# Patient Record
Sex: Female | Born: 2000
Health system: Southern US, Academic
[De-identification: ages and names within clinical notes are randomized; demographics above are authoritative.]

## PROBLEM LIST (undated history)

## (undated) ENCOUNTER — Ambulatory Visit

## (undated) ENCOUNTER — Ambulatory Visit: Attending: Pharmacist | Primary: Pharmacist

## (undated) ENCOUNTER — Encounter: Attending: Physician Assistant | Primary: Physician Assistant

## (undated) ENCOUNTER — Encounter
Payer: PRIVATE HEALTH INSURANCE | Attending: Rehabilitative and Restorative Service Providers" | Primary: Rehabilitative and Restorative Service Providers"

## (undated) ENCOUNTER — Encounter: Attending: Neurology | Primary: Neurology

## (undated) ENCOUNTER — Telehealth

## (undated) ENCOUNTER — Telehealth: Attending: Physician Assistant | Primary: Physician Assistant

## (undated) ENCOUNTER — Encounter

## (undated) ENCOUNTER — Encounter
Attending: Pharmacist Clinician (PhC)/ Clinical Pharmacy Specialist | Primary: Pharmacist Clinician (PhC)/ Clinical Pharmacy Specialist

## (undated) ENCOUNTER — Telehealth: Attending: Neurology | Primary: Neurology

## (undated) ENCOUNTER — Ambulatory Visit: Payer: PRIVATE HEALTH INSURANCE

## (undated) ENCOUNTER — Non-Acute Institutional Stay: Payer: PRIVATE HEALTH INSURANCE

## (undated) ENCOUNTER — Ambulatory Visit
Attending: Pharmacist Clinician (PhC)/ Clinical Pharmacy Specialist | Primary: Pharmacist Clinician (PhC)/ Clinical Pharmacy Specialist

## (undated) ENCOUNTER — Encounter: Payer: PRIVATE HEALTH INSURANCE | Attending: Obesity Medicine | Primary: Obesity Medicine

## (undated) ENCOUNTER — Encounter: Payer: PRIVATE HEALTH INSURANCE | Attending: Physician Assistant | Primary: Physician Assistant

## (undated) ENCOUNTER — Encounter: Attending: Registered" | Primary: Registered"

## (undated) ENCOUNTER — Ambulatory Visit: Payer: PRIVATE HEALTH INSURANCE | Attending: Physician Assistant | Primary: Physician Assistant

## (undated) ENCOUNTER — Telehealth: Attending: Nephrology | Primary: Nephrology

## (undated) ENCOUNTER — Ambulatory Visit: Payer: MEDICAID

## (undated) ENCOUNTER — Encounter: Attending: Pharmacist | Primary: Pharmacist

## (undated) ENCOUNTER — Non-Acute Institutional Stay
Payer: PRIVATE HEALTH INSURANCE | Attending: Rehabilitative and Restorative Service Providers" | Primary: Rehabilitative and Restorative Service Providers"

## (undated) ENCOUNTER — Encounter: Payer: PRIVATE HEALTH INSURANCE | Attending: Registered" | Primary: Registered"

## (undated) ENCOUNTER — Encounter
Attending: Rehabilitative and Restorative Service Providers" | Primary: Rehabilitative and Restorative Service Providers"

## (undated) ENCOUNTER — Encounter: Attending: Physical Medicine & Rehabilitation | Primary: Physical Medicine & Rehabilitation

## (undated) ENCOUNTER — Ambulatory Visit
Payer: PRIVATE HEALTH INSURANCE | Attending: Rehabilitative and Restorative Service Providers" | Primary: Rehabilitative and Restorative Service Providers"

## (undated) ENCOUNTER — Encounter: Attending: Mental Health | Primary: Mental Health

## (undated) ENCOUNTER — Encounter
Payer: PRIVATE HEALTH INSURANCE | Attending: Physical Medicine & Rehabilitation | Primary: Physical Medicine & Rehabilitation

## (undated) DIAGNOSIS — G35 Multiple sclerosis: Secondary | ICD-10-CM

## (undated) HISTORY — DX: Multiple sclerosis: G35

## (undated) MED ORDER — CHOLECALCIFEROL (VITAMIN D3) 100 MCG (4,000 UNIT) CAPSULE: ORAL | 0.00000 days

---
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
## 2020-01-04 NOTE — Unmapped (Signed)
[facility]  [address] [place], [place] [address]    [phone]    Donna Mata did not show for her  scheduled Speech Therapy follow-up session. SLP called preferred number listed to follow up but did not get a response [phone]). SLP left VM to call clinic to confirm future appointments. Please contact me if you have any questions or concerns.     Thank you for this referral,     Signed: Ilda Mori, SLP  01/04/2020 10:42 AM

## 2020-01-04 NOTE — Unmapped (Signed)
[facility]  [address] [place], [place] [address]    [phone]    Donnamarie Poag Tavella did not show for her  scheduled Occupational Therapy follow-up session.  Please contact me if you have any questions or concerns.     Thank you for this referral,     Signed: Dierdre Harness, OT  01/04/2020 10:24 AM

## 2020-01-04 NOTE — Unmapped (Signed)
[facility]  [address]                                    [place], [place] [address]    Chemika did not show for their scheduled Physical Therapy follow-up session.  Le has been contacted via telephone call.  I was unable to leave a voicemail due to the number on file is not her personal number   This is the patient's first offense.      Signed: Maudry Diego, PT  01/04/2020, 8:34 AM

## 2020-01-08 ENCOUNTER — Other Ambulatory Visit (HOSPITAL_COMMUNITY): Payer: Self-pay | Admitting: Psychiatry

## 2020-01-08 MED ORDER — PROMETHAZINE 12.5 MG TABLET
ORAL_TABLET | Freq: Four times a day (QID) | ORAL | 0 refills | 0 days | PRN
Start: 2020-01-08 — End: ?

## 2020-01-10 MED ORDER — PROMETHAZINE 12.5 MG TABLET
ORAL_TABLET | Freq: Four times a day (QID) | ORAL | 2 refills | 8.00000 days | Status: CP | PRN
Start: 2020-01-10 — End: ?

## 2020-01-10 NOTE — Unmapped (Signed)
Last Visit Date: 11/04/2019  Next Visit Date: No future appointment scheduled  RECALLS set 01/04/2020    REFILL REQUEST  Promethazine(Phenergan)12.5 m  Last written 11/16/2019 QTY 30 tablet with no refill        [name]  Lab Results   Component Value Date    JC Virus AB POSITIVE 08/13/2016    Hep B Surface Ag Nonreactive 03/19/2019    Hep B S Ab Nonreactive 03/12/2018    Hep B Surf Ab Quant <8.00 03/12/2018    Hep B Core Total Ab Nonreactive 03/19/2019    Hepatitis C Ab Reactive (A) 07/06/2018    HIV Antigen/Antibody Combo Nonreactive 03/12/2018    HIV Antigen/Antibody Combo Nonreactive 07/21/2014        No results found for this or any previous visit.      No results found for this or any previous visit.      No results found for this or any previous visit.

## 2020-01-17 NOTE — Unmapped (Signed)
[facility]  [address] [place], [place] [address]    [phone]    Gyselle did not show for their scheduled Physical Therapy follow-up session.  Anniya has been contacted via telephone call.  I was unable to leave a voicemail due to the number on file is not her personal number   This is the patient's second offense. If she does not show for her next appointment she will be discharged from skilled PT due to non-compliance with our attendance policy.     Thank you for this referral,     Signed: Maudry Diego, PT  01/17/2020 4:51 PM

## 2020-01-17 NOTE — Unmapped (Signed)
[facility]  [address]                                    [place], [place] [address]    Donna Mata did not show for scheduled ST Therapy follow-up session.  Donna Mata has been contacted via telephone call. to starred phone number in Epic (her mother's mobile phone number) without response/answer.  This is the patient's second offense. Please contact me with any questions.     Signed: Sadie Haber, SLP  01/17/2020, 2:38 PM

## 2020-01-20 DIAGNOSIS — G35 Multiple sclerosis: Principal | ICD-10-CM

## 2020-01-20 NOTE — Unmapped (Addendum)
[facility]  Multiple Sclerosis / Neuroimmunology Division  Carola Viramontes [name] [facility]  Physician Assistant    Phone: [phone]  Fax: [phone]      Patient Name: Donna Mata [facility]   Date of Birth: 31-Jul-2000  Medical Record Number: [id number]  [address]  [place] [address]     Direct entry by:  Cy Blamer, PA-C.  Supervising Physician: Dr. [name] Dujmovic/ Dr. Quillian Quince.    DATE OF VISIT: January 20, 2020    REASON FOR VISIT: Followup in the Neuroimmunology Clinic for evaluation of Multiple Sclerosis / Demyelinating Disease / or other Neurological problem.    **Telophone call with [employer]. Total time < 5 minutes. No charge for this visit.  **Seen by Dr. Johnnye Lana 04/24/2016.    ASSESSMENT AND PLAN:  ** Relapsing Remitting Multiple Sclerosis.  **Interim visit to get an update of symptoms.  -Continuation of pain, numbness and tingling of lower extremities, slightly worse. Continue with Amitriptyline.  -Neurogenic bladder, (Leakage and urgency): Continue to monitor.    -Recommend thoracic MRI.  Brain and cervical spine from 11/23/2019 show no changes.  -Started Kesimpta 04/12/2019.    -Follow up as scheduled.    INTERVAL HISTORY / CHIEF COMPLAINT :  Interim visit to get an update of symptoms. Patient states that her lower extremity pain, numbness and tingling has gotten slightly worse since our last visit in July 2021. It has slowly been progressing. Reports no weakness or problems with walking. Denies Fever, chills, infections or UTI's. Currently taking Amitriptyline at bedtime.     PRIOR HISTORY: A 19 y.o.caucasian female.  Patient presented to [facility] for parasthesias and weakness with notable multiple enhancing and non enhancing lesions throughout brain and spinal cord consistent with MS. Lesions not typical for ADEM and she had no prior viral illness or immunization before episode. Her lumbar puncture was also consistent with demyelinating disease with no evidence of infection and elevated IGG index with 4+ oligoclonal bands. Diagnosed with RRMS by Dr. Alberteen Spindle 09/2014 and started on Gilenya.    Hep C AB positive, Hep C RNA, quantitative, ??PCR undetected. LFT's wnl. ??   Phone consult with Hepatology Dr Woodfin Ganja. repeated Hep C AB positve. Most likely false positive.  There is a 5% chance of vertical transmission.  If this were to be the case most children clear the virus spontaneously by age three. Recommendation would still be the same as above.    LUMBAR PUNCTURE:  07/21/2014 Four or more Oligoclonal bands and IgG index = 0.8.    SCREENING LABS:   07/21/2014 : ACE 20, ANA neg, RF <6.3, , B12 420 , NMO APQ4 IgG neg and Vit D 25-OH 20.  04/17/2017  RPR,  Lyme serology and   MOG IgG1 are all negative.  03/12/2018 JCV positive, index = 3.51.    07/17/2017 Baseline:  T25FW= 4.98. seconds.  MMSE = 26.  PHQ9 = 8.    MS FLARE-UP HISTORY:  Received IVMP while in hospital at time of diagnoses.  04/24/2016 Left arm and left leg weakness, left-sided facial and body sensory disturbance with perception of pain, sleep disturbances in the setting of URI. Received 5 days of IVMP with 100% resolution.  04/26/2018  Left side weakness and  numbness and tingling from head to toe Seen at [facility] ED and received IV Steroids x one. Sent home with oral Prednisone for 4 days. !00% resolved.  Clinical flare-up from  05/01/2018 100% resolved. Symptoms were Numbness or tingling. Received one day of IV steroids followed by four days of oral.  11/16/2018 Received 3 days of oral steroids 1250mg  for eye symptoms. Eye symptoms were unchanged with the steroids.    MS MEDICATION:  Gilenya started 12/13/2014 -08/2016.  Flare up 03/2016 that required 5 days of IVMP with 100% resolution. New ring enahnacing lesion seen on brain MRI and new cervical spine non enhancing lesion.  JCV = positive with index= 3.51.  Tecfidera 08/2016 - 03/2018.  induction of Ocrevus 300mg  on  04/23/2018 (partial) and 300mg  on  05/07/2018 (full) and  600mg  11/09/2018 ( partial). Thickness/knot/ irratation in throat, stomach burning. Discontinued 11/09/2018.  Oral prednisone monthly starting 11/2018.    GYN HISTORY HISTORY:  Menses started at age 19.    FAMILY HISTORY:  No MS. Mother under going work up for Lupus.  Great grandfather and grandmother with IDDM.    REVIEW OF SYSTEMS:  A 10-systems review was performed and, unless otherwise noted, declared negative by patient.    No visits with results within 1 Month(s) from this visit.   Latest known visit with results is:   Appointment on 03/19/2019   Component Date Value Ref Range Status   ??? Total IgG 03/19/2019 762  600-1,700 mg/dL Final   ??? Rituxin ZO10% 03/19/2019 100   Final   ??? CD3% (T Cells) 03/19/2019 83  61 - 86 % Final   ??? Absolute CD3 Count 03/19/2019 1,651  915-3,400 /uL Final   ??? CD19% (B Cells) 03/19/2019 1* 7 - 23 % Final   ??? Absolute CD19 Count 03/19/2019 20* 105 - 920 /uL Final   ??? CD16/56% NK Cell 03/19/2019 15  1 - 27 % Final   ??? Absolute CD16/56 Count 03/19/2019 298  15-1,080 /uL Final   ??? IgM 03/19/2019 92  35 - 290 mg/dL Final       No visits with results within 1 Month(s) from this visit.   Latest known visit with results is:   Appointment on 03/19/2019   Component Date Value Ref Range Status   ??? Total IgG 03/19/2019 762  600-1,700 mg/dL Final   ??? Rituxin RU04% 03/19/2019 100   Final   ??? CD3% (T Cells) 03/19/2019 83  61 - 86 % Final   ??? Absolute CD3 Count 03/19/2019 1,651  915-3,400 /uL Final   ??? CD19% (B Cells) 03/19/2019 1* 7 - 23 % Final   ??? Absolute CD19 Count 03/19/2019 20* 105 - 920 /uL Final   ??? CD16/56% NK Cell 03/19/2019 15  1 - 27 % Final   ??? Absolute CD16/56 Count 03/19/2019 298  15-1,080 /uL Final   ??? IgM 03/19/2019 92  35 - 290 mg/dL Final       PROBLEM LIST:    Patient Active Problem List   Diagnosis   ??? Fatigue   ??? Rash   ??? Obesity   ??? Multiple sclerosis (CMS-HCC)   ??? Multiple sclerosis exacerbation (CMS-HCC)   ??? Neuropathic pain   ??? Chronic bilateral low back pain without sciatica   ??? Eye pain, bilateral   ??? Type 2 diabetes mellitus without complication (CMS-HCC)   ??? Immunosuppression due to drug therapy   ??? BMI 50.0-59.9, adult (CMS-HCC)     Current Outpatient Medications   Medication Sig Dispense Refill   ??? acetaminophen (TYLENOL ORAL) Take by mouth.     ??? amantadine HCL (SYMMETREL) 100 mg capsule ONE TWO CAPSULS FIRST THING IN THE  MORNING. IF NEEDED MAY TAKE A ONE TO TWO CAPSULES BETWEEN 12-2PM. 60 capsule 5   ??? amitriptyline (ELAVIL) 25 MG tablet Three tablets at bedtime. 90 tablet 5   ??? buPROPion (WELLBUTRIN XL) 300 MG 24 hr tablet Take 1 tablet (300 mg total) by mouth every morning. 30 tablet 2   ??? cholecalciferol, vitamin D3-1,250 mcg, 50,000 unit,, 1,250 mcg (50,000 unit) capsule Take 1 capsule (1,250 mcg total) by mouth once a week. 24 capsule 0   ??? cyclobenzaprine (FLEXERIL) 5 MG tablet Take by mouth.     ??? diphenhydramine HCl (BENADRYL ORAL) Take by mouth.     ??? naproxen (NAPROSYN) 500 MG tablet TAKE 1 TABLET BY MOUTH TWICE A DAY WITH MEALS     ??? ofatumumab (KESIMPTA PEN) 20 mg/0.4 mL PnIj Inject contents of 1 syringe (20 mg) under the skin every 28 days. 1.6 mL 5   ??? pantoprazole (PROTONIX) 20 MG tablet Take 1 tablet (20 mg total) by mouth two (2) times a day. 180 tablet 1   ??? promethazine (PHENERGAN) 12.5 MG tablet TAKE 1 TABLET (12.5 MG TOTAL) BY MOUTH EVERY SIX (6) HOURS AS NEEDED FOR NAUSEA. 30 tablet 2   ??? traZODone (DESYREL) 100 MG tablet Take 100 mg by mouth.       No current facility-administered medications for this visit.         Past Surgical Hx:    Past Surgical History:   Procedure Laterality Date   ??? NO PAST SURGERIES         Social Hx:    Social History     Socioeconomic History   ??? Marital status: Single     Spouse name: Not on file   ??? Number of children: Not on file   ??? Years of education: Not on file   ??? Highest education level: Not on file   Occupational History   ??? Not on file Tobacco Use   ??? Smoking status: Never Smoker   ??? Smokeless tobacco: Never Used   Substance and Sexual Activity   ??? Alcohol use: No     Alcohol/week: 0.0 standard drinks   ??? Drug use: No   ??? Sexual activity: Never   Other Topics Concern   ??? Not on file   Social History Narrative    Patient lives at home with Mom, stepfather, and 46 yo brother. She is in 8th grade and is currently not doing well in school. She denies bullying or other psychosocial stressors. She fells safe at home. She denies tobacco, alcohol, and drug use. She has never been sexually active. She is on OCPs to regular her periods. LMP was 2/28.      Social Determinants of Health     Financial Resource Strain:    ??? Difficulty of Paying Living Expenses:    Food Insecurity:    ??? Worried About Programme researcher, broadcasting/film/video in the Last Year:    ??? Barista in the Last Year:    Transportation Needs:    ??? Freight forwarder (Medical):    ??? Lack of Transportation (Non-Medical):    Physical Activity:    ??? Days of Exercise per Week:    ??? Minutes of Exercise per Session:    Stress:    ??? Feeling of Stress :    Social Connections:    ??? Frequency of Communication with Friends and Family:    ??? Frequency of Social Gatherings with Friends and Family:    ???  Attends Religious Services:    ??? Active Member of Clubs or Organizations:    ??? Attends Banker Meetings:    ??? Marital Status:        Family Hx:    Family History   Problem Relation Age of Onset   ??? Diabetes type II Mother    ??? Asthma Mother    ??? Diabetes Mother    ??? Hypertension Mother    ??? No Known Problems Father    ??? ADD / ADHD Brother    ??? Cancer Maternal Grandmother    ??? Glaucoma Neg Hx    ??? Macular degeneration Neg Hx    ??? Retinal detachment Neg Hx    ??? Strabismus Neg Hx      ALLERGIES:    Allergies   Allergen Reactions   ??? Ocrelizumab Anaphylaxis

## 2020-01-20 NOTE — Unmapped (Signed)
erro  neous encounter

## 2020-01-21 NOTE — Unmapped (Signed)
Donna Mata/Donna Mata  I just received a fax from the insurance company that I will load to patient's chart.    It's an approval for the MRI thoracic spine.    I sent a My Chart message to the patient/mom to advise and provided them with the phone # to Radiology Scheduling to call and make the appointment.     Thanks, [employer]

## 2020-02-01 ENCOUNTER — Encounter: Admit: 2020-02-01 | Discharge: 2020-02-01 | Payer: PRIVATE HEALTH INSURANCE

## 2020-02-01 ENCOUNTER — Ambulatory Visit
Admit: 2020-01-17 | Discharge: 2020-02-01 | Payer: PRIVATE HEALTH INSURANCE | Attending: Rehabilitative and Restorative Service Providers" | Primary: Rehabilitative and Restorative Service Providers"

## 2020-02-01 ENCOUNTER — Ambulatory Visit: Admit: 2020-01-17 | Discharge: 2020-02-01 | Payer: PRIVATE HEALTH INSURANCE

## 2020-02-01 ENCOUNTER — Ambulatory Visit
Admit: 2020-01-04 | Discharge: 2020-02-01 | Payer: PRIVATE HEALTH INSURANCE | Attending: Rehabilitative and Restorative Service Providers" | Primary: Rehabilitative and Restorative Service Providers"

## 2020-02-01 ENCOUNTER — Encounter
Admit: 2020-02-01 | Discharge: 2020-02-01 | Payer: PRIVATE HEALTH INSURANCE | Attending: Rehabilitative and Restorative Service Providers" | Primary: Rehabilitative and Restorative Service Providers"

## 2020-02-01 NOTE — Unmapped (Signed)
OUTPATIENT OCCUPATIONAL THERAPY   Note Type: Treatment Note         Patient Name: Donna Mata  Date of Birth:2001-03-23  Date: 02/01/2020  Visit #:  [id number]  Referring Physician: Champ Mata, Ire*  Date of Onset of Injury: 2016   Date of Evaluation: 11/04/19  Plan of Care: medicaid 10 visits remaining   Encounter Diagnoses   Name Primary?   ??? Multiple sclerosis (CMS-HCC) Yes   ??? Type 2 diabetes mellitus without complication, without long-term current use of insulin (CMS-HCC)        ASSESSMENT  Patient is a 19 y.o. year old female with history of Multiple Sclerosis, Type 2 Diabetes (diagnosis in the chart but patient reports it is pre-diabetes), generalized fatigue, weakness and apathy at times who presents with difficulty to complete basic ADLs as well as IADLs.  Patient has begun her schooling for 3 out of 4 of her classes.  Will start the 4th class in about 1 week.  She reports no concerns to complete school work but benefited from compensatory training for cooking, cleaning and school work.  Began bilateral hand strength coordination and education.  Patient requires skilled outpatient Occupational Therapy services to address the above deficits and maximize independence with ADLs and IADLs in home environment.    After review of pt's occupational profile and history, assessment of occupational performance, clinical decision making, and development of POC, pt presents as a moderate complexity case.     Patient wore a mask for the entire therapy session., Patient's companion wore a mask for the entire therapy session., Therapist wore a mask for the entire session.  and Therapist wore eye goggles/frames during the entire session.        Goals  1. In 4 visits, patient will perform home exercise program with need for cuing to max IND with ADLs and IADLs.  2. In 4 visits, patient will report at least 1 compensatory strategy to improve school work participation and endurance.  3. In 4 visits, patient will report at least 1 energy conservation strategy to improve cooking and cleaning participation.   4. In 4 visits, patient will improve her fine motor coordination as assessed by 9 hole peg test to Right 32 secs and Left 31secs or better.  5. In 4 visits, patient will participation in further visual assessment.       Patient in agreement with plan of care?: yes    Prognosis for goal achievement: Good due to good motivation and supportive family.    PLAN  Pt. will participate in:  Therapeutic Exercise  Therapeutic Acitivity  Neuromuscular Re-education  Self-care home training    Planned frequency and duration of treatment:  1x every other week for 4 weeks.  Plan will be adjusted as needed.     Past Medical History:   Past Medical History:   Diagnosis Date   ??? Hematuria 2006    resolved   ??? Migraine 10/12/2018   ??? MS (multiple sclerosis) (CMS-HCC) 07/17/2014   ??? Obesity    ??? Perinatal subependymal hemorrhage 2002    confirmed by [place]   ??? Type 2 diabetes mellitus without complication (CMS-HCC) 06/28/2019   ??? Vitamin D deficiency 2016       Past Surgical History:   Past Surgical History:   Procedure Laterality Date   ??? NO PAST SURGERIES         SUBJECTIVE  Online classes: history, office application, advanced office application, english  4 classes this semester  3 hours a day of school work   Taking breaks about every hour   Sit on couch to watch tv or stretch back   Taking dog out every 35 hours   6 week old dog     History of Present Condition:   Per PA Note:  Relapsing Remitting Multiple Sclerosis.  -Started Kesimpta 04/12/2019.  -Reviewed labs from OSH from 09/03/2019 which are located in Care Everywhere.  -Keep MRI appointment on 10/11/2019.  ??  **Cognitive decline: Refer to [facility] speech for testing and treatment plan.  **Fatigue: Start Amantadine 100mg , May take one two capsuls first thing in the morning. If needed repeat between 12-2pm.  **Polypharmacy: Followed by pain management and PCP who started patient on Tramodol 50mg  prn ,Trazodone 100mg  at bedtime and Flexeril 5mg  prn along with Amitriptyline 25mg  at bedtime and Phenergan 12.5mg  prn  that I prescribe. Discussed this with patient and mother .   Advised that if she feels like Amitriptyline is not working and wishes to discontinue it that she will need to decrease by 25% of the daily dose every 2 weeks. My chart message from patient and she confirms that she is taking Amitriptyline 75mg  at bedtime and wishes to continue on this dose.  ??  **Improved right ear symptoms. Continue to monitor.  ??  -Follow up in 3 months. Discuss the timing of the COVID-19 vaccine.    Precautions: fall risk, memory concerns     Prior Therapies: OT in hospital and post 5 years prior     Patient???s Goals: to finish school and obtain job    Pain  Pain present?: yes;  Location: bilateral knees shooting pain    Frequency/Duration: continuously  Intensity:  5 /10 today.   Aggravating Factors: walking for longer   Relieving Factors:meds helps to reduce     Social History:   Lives with mom Donna Mata   1 level home 3 steps to enter      Tub shower 1 installed and 2 suction cups   has a shower chair in storage   Regular toilet no concerns         2 adult cats and kitten     OBJECTIVE    Self Care Training 25 mins:    Occupational Therapy on 02/01/20:  Text to speech to listen to books or other notes for school work   Cooking Helpful Tips:  ??? Prepare - get items you need before you start a task  o Getting all the items out at one time for cooking to reduce steps and time in standing vs as you are going  ??? Cleaning while you cook- throwing items away as you go and rinse and dawn spray before sitting down to eat     Work Simplification  ??? Slide objects instead of lifting or carrying (like a pot full of water slide on counter top)  ??? Keep most used things on counter   ??? Work on things at waist height or sitting at table (sitting on step ladder to chop; prechopping as able)  ??? Avoid stooping, bending, or reaching over head (using step stool)    Therapeutic Exercise 20 mins:    Began red therputty with education on exercise with prolonged engagement vs maxing out.  Found 10 while balls, made balls, rolled out and pinched with each hand at same time  Completed grooved peg board with each hand and removed with tweezers  Required 3 min rest break after completing each part due  to cmc area cramping      Total Treatment Time:  45 minutes    Education:  Topics:Disease Process   OT POC  Education Provided to: patient and mother  Education Type:Explanation  Response to education/teachback:Verbal understanding recieved    I attest that I have reviewed the above information.  Signed: Dierdre Harness, OT  02/01/2020 8:34 AM

## 2020-02-01 NOTE — Unmapped (Signed)
NEUROLOGIC OUTPATIENT PHYSICAL THERAPY  Reassessment Note      Patient Name: Donna Mata  Date of Birth:02/13/2001  Date: 02/01/2020  Session Number:  2   Therapy Diagnosis:   Encounter Diagnoses   Name Primary?   ??? Multiple sclerosis (CMS-HCC) Yes   ??? Dizziness    ??? Gait instability        Date of Injury/Onset: 07/27/2014  Date of Evaluation: 11/04/2019  Referring Pracitioner: Champ Mungo, Ire*   Certification Dates: current authorization window 11/17/19 - 02/27/20 (12 visits); Landis Martins messaged on 02/01/20 regarding extending authorization window through 04/18/2020     Patient removed their mask during the session due to drinking water, Therapist wore a mask for the entire session.  and Therapist wore eye goggles/frames during the entire session.     ASSESSMENT:    19 y.o. female with RRMS presents with dizziness, gait instability, decreased hip/core strength, and decreased endurance which is limiting her safety with household and community ambulation and ability to fully and safely participate in return to exercise activities. Pt's 5xSTS of 13 seconds and 30 second STS of 14 reps is indicative of increased falls risk and decreased functional strength. Her FGA score of 18/30, difficulty with single limb stance, and difficulty with condition 4 of modified CTSIB further indicates increased risk for falls. Pt ambulated 1,310 ft during which is indicative of endurance deficits. She reports dizziness with looking up, quick and slow head turns, and bending over. Her DHI of 48 indicates 48% perceived disability due to dizziness and she may benefit from vestibular assessment and treatment.  Patient will benefit from skilled Physical Therapy services for strengthening, gait and balance training, vestibular assessment and treatment, and building endurance to reduce her risk for falls, to increase her independence with community ambulation, and improve her safety with return to exercise.     Next session: vestibular assessment and treatment, HEP review and progression if needed      Goals   Patient/Family Goals: reduce falls, improve balance   ??  Short Term Goals:  In 4 weeks:  Patient will be able to properly demonstrate current HEP independently x1 in clinic to build upon functional gains in PT.   Patient will perform STS transfers with no need to reposition feet for balance upon standing and SBA or less assistance to increase independence and safety with transfers. MET 10/5  Patient will complete TUG and CogTUG testing, as well as to further inform care. MET 10/5  ??  Long Term Goals:  12 weeks:  Patient will be able to properly demonstrate current HEP independently x1 in clinic to build upon functional gains in PT.   Patient will increase SLS balance to >10s Bilaterally to improve safety with community ambulation and decrease future risk of falls.  Patient will ascend and descend 3 stairs with no assistance, with 1 to no handrails and safe technique demonstrating step-through pattern in order to increase independence with household/community mobility.    Patient will improve to 1,460 ft or more to show significantly improved endurance for increased independence with community ambulation.  Patient will ambulate 1.2 m/s or faster with LRAD mod-I to demonstrate increased safety with community ambulation, such as crossing streets at crosswalks.  Patient will improve FGA to 23/30 or more to show further reduction in falls risk.   Patient will improve feet together eyes closed on foam to 15 seconds or more to show improved interpretation of vestibular cues.   Further vestibular  goals to be added upon vestibular assessment    PLAN:  1 x a week for 12 weeks ( 6 sessions balance focus and 6 sessions vestibular focus)     SUBJECTIVE:  Pt states: that she missed her first two appointments because of school work and transportation issues. Pt states things have been going pretty good. Pt states no falls or near falls since July 2021. Pt states she wants to work on her balance and her dizziness which she gets when she looks up or turns her head quickly.     Reason for Referral/History of Present Condition/Onset of injury/exacerbation:   19 y.o. female presents to the [facility] with impaired balance and decreased hip/core strength with frequent falls consistent with pt's RRMS symptoms    OBJECTIVE:  Pain: 5/10 below the knee on the L leg     PPT: 40 min     5xSTS 13 seconds with arms across chest     30 second STS 14 reps with arms across chest     4 stage balance test   FT 30 seconds   ST with R leading 30 sec  ST with L leading 30 sec  T with R leading 30 sec  T with L leading 30 sec   SLS R 5.6 sec  SLS L 2.3 sec    -Modified CTSIB:    - EO Firm: 30 sec   - EC Firm: 30 sec with min postural sway    - EO Foam: 30 sec with min postural sway    - EC Foam:  7 sec, eyes opened    Gait speed:  8.63 seconds: 1.16 m/s      Functional Gait Assessment    Requirements: A marked 6-m (20-ft) walkway that is marked with a 30.48-cm (12-in) width.    1. GAIT LEVEL SURFACE  3 ( 5.4 seconds)   Instructions: Walk at your normal speed from here to the next mark (6 m[20 ft]).  Grading: Loraine Leriche the highest category that applies.  (3) Normal???Walks 6 m (20 ft) in less than 5.5 seconds, no assistive devices, good speed, no evidence for imbalance, normal gait  pattern, deviates no more than 15.24 cm (6 in) outside of the 30.48-cm (12-in) walkway width.  (2) Mild impairment???Walks 6 m (20 ft) in less than 7 seconds but greater than 5.5 seconds, uses assistive device, slower speed,  mild gait deviations, or deviates 15.24???25.4 cm (6???10 in) outside of the 30.48-cm (12-in) walkway width.  (1) Moderate impairment???Walks 6 m (20 ft), slow speed, abnormal gait pattern, evidence for imbalance, or deviates 25.4???  38.1 cm (10???15 in) outside of the 30.48-cm (12-in) walkway width. Requires more than 7 seconds to ambulate 6 m (20 ft).  (0) Severe impairment???Cannot walk 6 m (20 ft) without assistance, severe gait deviations or imbalance, deviates greater than 38.1  cm (15 in) outside of the 30.48-cm (12-in) walkway width or reaches and touches the wall.    2. CHANGE IN GAIT SPEED 3  Instructions: Begin walking at your normal pace (for 1.5 m [5 ft]). When I tell you ???go,??? walk as fast as you can (for 1.5 m [5 ft]). When I tell you ???slow,??? walk as slowly as you can (for 1.5 m [5 ft]).  Grading: Loraine Leriche the highest category that applies.  (3) Normal???Able to smoothly change walking speed without loss of balance or gait deviation. Shows a significant difference in  walking speeds between normal, fast, and slow speeds. Deviates no more  than 15.24 cm (6 in) outside of the 30.48-cm (12-in) walkway width.  (2) Mild impairment???Is able to change speed but demonstrates mild gait deviations, deviates 15.24???25.4 cm (6???10 in) outside  of the 30.48-cm (12-in) walkway width, or no gait deviations but unable to achieve a significant change in velocity, or uses an  assistive device.  (1) Moderate impairment???Makes only minor adjustments to walking speed, or accomplishes a change in speed with significant  gait deviations, deviates 25.4???38.1 cm (10???15 in) outside the 30.48-cm (12-in) walkway width, or changes speed but loses balance but is able to recover and continue walking.  (0) Severe impairment???Cannot change speeds, deviates greater than 38.1 cm (15 in) outside 30.48-cm (12-in) walkway width,  or loses balance and has to reach for wall or be caught.    3. GAIT WITH HORIZONTAL HEAD TURNS 2  Instructions: Walk from here to the next mark 6 m (20 ft) away. Begin walking at your normal pace. Keep walking straight; after 3 steps, turn your head to the right and keep walking straight while looking to the right. After 3 more steps, turn your head to the left and keep walking straight while looking left. Continue alternating looking right and left every 3 steps until you have completed 2 repetitions in each direction.  Grading: Loraine Leriche the highest category that applies.  (3) Normal???Performs head turns smoothly with no change in gait. Deviates no more than 15.24 cm (6 in) outside 30.48-cm (12-in)  walkway width.  (2) Mild impairment???Performs head turns smoothly with slight change in gait velocity (eg, minor disruption to smooth gait path), deviates 15.24???25.4 cm (6???10 in) outside 30.48-cm (12-in) walkway width, or uses an assistive device.  (1) Moderate impairment???Performs head turns with moderate change in gait velocity, slows down, deviates 25.4???38.1 cm  (10???15 in) outside 30.48-cm (12-in) walkway width but recovers,  can continue to walk.  (0) Severe impairment???Performs task with severe disruption of gait (eg, staggers 38.1 cm [15 in] outside 30.48-cm (12-in) walkway  width, loses balance, stops, or reaches for wall).    4. GAIT WITH VERTICAL HEAD TURNS 2 ( reports little dizzy)  Instructions: Walk from here to the next mark (6 m [20 ft]). Begin walking at your normal pace. Keep walking straight; after 3 steps, tip your head up and keep walking straight while looking up. After 3 more steps, tip your head down, keep walking straight while looking down. Continue alternating looking up and down every 3 steps until you have completed 2 repetitions in each direction.  Grading: Loraine Leriche the highest category that applies.  (3) Normal???Performs head turns with no change in gait. Deviates no more than 15.24 cm (6 in) outside 30.48-cm (12-in) walkway  width.  (2) Mild impairment???Performs task with slight change in gait velocity (eg, minor disruption to smooth gait path), deviates 15.24???25.4 cm (6???10 in) outside 30.48-cm (12-in) walkway width or uses assistive device.  (1) Moderate impairment???Performs task with moderate change in gait velocity, slows down, deviates 25.4???38.1 cm (10???15 in)  outside 30.48-cm (12-in) walkway width but recovers, can continue to walk.  (0) Severe impairment???Performs task with severe disruption of gait (eg, staggers 38.1 cm [15 in] outside 30.48-cm (12-in) walkway  width, loses balance, stops, reaches for wall).    5. GAIT AND PIVOT TURN 3   Instructions: Begin with walking at your normal pace. When I tell you, ???turn and stop,??? turn as quickly as you can to face the opposite direction and stop.  Grading: Loraine Leriche the highest category that applies.  (  3) Normal???Pivot turns safely within 3 seconds and stops quickly with no loss of balance.  (2) Mild impairment???Pivot turns safely in _3 seconds and stops with no loss of balance, or pivot turns safely within 3 seconds  and stops with mild imbalance, requires small steps to catch balance.  (1) Moderate impairment???Turns slowly, requires verbal cueing, or requires several small steps to catch balance following turn and  stop.  (0)Severe impairment???Cannot turn safely, requires assistance to turn and stop.    6. STEP OVER OBSTACLE 1 ( must slow down and adjust speed)  Instructions: Begin walking at your normal speed. When you come to the shoe box, step over it, not around it, and keep walking.  Grading: Loraine Leriche the highest category that applies.  (3) Normal???Is able to step over 2 stacked shoe boxes taped together (22.86 cm [9 in] total height) without changing gait speed; no evidence of imbalance.  (2) Mild impairment???Is able to step over one shoe box (11.43 cm [4.5 in] total height) without changing gait speed; no evidence  of imbalance.  (1) Moderate impairment???Is able to step over one shoe box (11.43 cm [4.5 in] total height) but must slow down and adjust steps to  clear box safely. May require verbal cueing.  (0) Severe impairment???Cannot perform without assistance.     7. GAIT WITH NARROW BASE OF SUPPORT 1 ( 7 steps, unstable)   Instructions: Walk on the floor with arms folded across the chest, feet aligned heel to toe in tandem for a distance of 3.6 m [12 ft]. The number of steps taken in a straight line are counted for a maximum of 10 steps.  Grading: Loraine Leriche the highest category that applies.  (3) Normal???Is able to ambulate for 10 steps heel to toe with no staggering.  (2) Mild impairment???Ambulates 7???9 steps.  (1) Moderate impairment???Ambulates 4???7 steps.  (0) Severe impairment???Ambulates less than 4 steps heel to toe or cannot perform without assistance.    8. GAIT WITH EYES CLOSED 1 ( veering towards R)   Instructions: Walk at your normal speed from here to the next mark (6 m[20 ft]) with your eyes closed.  Grading: Loraine Leriche the highest category that applies.  (3) Normal???Walks 6 m (20 ft), no assistive devices, good speed, no evidence of imbalance, normal gait pattern, deviates no more  than 15.24 cm (6 in) outside 30.48-cm (12-in) walkway width. Ambulates 6 m (20 ft) in less than 7 seconds.  (2) Mild impairment???Walks 6 m (20 ft), uses assistive device, slower speed, mild gait deviations, deviates 15.24???25.4 cm  (6???10 in) outside 30.48-cm (12-in) walkway width. Ambulates 6 m (20 ft) in less than 9 seconds but greater than 7 seconds.  (1) Moderate impairment???Walks 6 m (20 ft), slow speed, abnormal gait pattern, evidence for imbalance, deviates 25.4???38.1  cm (10???15 in) outside 30.48-cm (12-in) walkway width. Requires more than 9 seconds to ambulate 6 m (20 ft).  (0) Severe impairment???Cannot walk 6 m (20 ft) without assistance, severe gait deviations or imbalance, deviates greater than 38.1  cm (15 in) outside 30.48-cm (12-in) walkway width or will not attempt task.    9. AMBULATING BACKWARDS 1 ( slow, unstable, 12.7 seconds)   Instructions: Walk backwards until I tell you to stop.  Grading: Loraine Leriche the highest category that applies.  (3) Normal???Walks 6 m (20 ft), no assistive devices, good speed, no evidence for imbalance, normal gait pattern, deviates no  more than 15.24 cm (6 in) outside 30.48-cm (12-in) walkway width.  (2) Mild  impairment???Walks 6 m (20 ft), uses assistive device, slower speed, mild gait deviations, deviates 15.24???25.4 cm (6???10 in) outside 30.48-cm (12-in) walkway width.  (1) Moderate impairment???Walks 6 m (20 ft), slow speed, abnormal gait pattern, evidence for imbalance, deviates 25.4???38.1  cm (10???15 in) outside 30.48-cm (12-in) walkway width.  (0) Severe impairment???Cannot walk 6 m (20 ft) without assistance, severe gait deviations or imbalance, deviates greater than 38.1  cm (15 in) outside 30.48-cm (12-in) walkway width or will not attempt task.    10. STEPS 1  Instructions: Walk up these stairs as you would at home (ie, using the rail if necessary). At the top turn around and walk down.  Grading: Loraine Leriche the highest category that applies.  (3) Normal???Alternating feet, no rail.  (2) Mild impairment???Alternating feet, must use rail.  (1) Moderate impairment???Two feet to a stair; must use rail.  (0) Severe impairment???Cannot do safely.    TOTAL SCORE: 18   MAXIMUM SCORE 30    Physical Therapy . Volume 84 . Number 10 . October 2004 Wrisley et al . [id number]      TUG 9.4 seconds     Cog TUG 11.11 seconds ( > 10% difference)     : 1,310 ft with no rest breaks     Dizziness Handicap Inventory    Please rate your symptoms below: (options are always, sometimes, or no   QUESTIONS No symptoms ???0??? ; Sometimes ???2; Always ???4???   P1 Does looking up increase your problem? 4   E2 Because of your problem, do you feel frustrated? 2   F3 Because of your problem, do you restrict your travel for business or pleasure? 2   P4 Does walking down the aisle of a supermarket increase your problem? 2   F5 Because of your problem, do you have difficulty getting into or out of bed? 0   F6 Does your problem significantly restrict your participation in social activities, such as going out to dinner, going to movies, dancing or to parties? 2   F7 Because of your problem, do you have difficulty reading? 0   F8 Does performing more ambitious activities like sports, dancing, and household chores, such as sweeping or putting dishes away; increase your problem? 2   E9 Because of your problem, are you afraid to leave your home without having someone accompany you? 4   E10 Because of your problem, have you been embarrassed in front of others? 4   P11 Do quick movements of your head increase your problem? 2   F12 Because of your problem, do you avoid heights? 2   P13 Does turning over in bed increase your problem? 0   F14 Because of your problem, is it difficult for you to do strenuous housework or yard work? 4   E15 Because of your problem, are you afraid people may think that you are intoxicated? 0   F16 Because of your problem, is it difficult for you to go for a walk by yourself? 4   P17 Does walking down a sidewalk increase your problem? 2   E18 Because of your problem, is it difficult for you to concentrate? 2   F19 Because of your problem, is it difficult for you to walk around your house in the dark? 4   E20 Because of your problem, are you afraid to stay home alone? 2   E21 Because of your problem, do you feel handicapped? 0   E22 Has your problem placed stress on your  relationship with members of your family or friends? 0   E23 Because of your problem, are you depressed? 0   F24 Does your problem interfere with your job or household responsibilities? 2   P25 Does bending over increase your problem? 2     Subscales:  Total:  48   Top score = 100 (maximum perceived disability); Bottom score is 0 (no perceived difficulty)      There Ex: 8 min   STS with 5 second descent   3 way hip strengthening   Standing march with 3 second hold   Stride-Tandem balance with head turns   Stride-Tandem balance with EC   Tandem Walking  Backward Walking     Educated on walking program of 6-10 minutes daily. Provided pt with handout of HEP and exercise calendar for improved compliance. Pt verbalized agreement and understanding.     Patient Education: Throughout session patient educated regarding the following: Role of PT in Rehabilitation, HEP, importance of therapy, posture, body mechanics, body awareness, treatment plan and Indications/Contraindications to Exercises. Patient demonstrated and verbalized agreement and understanding.  __________________________________________________________________________________________________________-    Home Exercise Program, in physiotec under pt's name:   STS with 5 second descent   3 way hip strengthening   Standing march with 3 second hold   Stride-Tandem balance with head turns   Stride-Tandem balance with EC   Tandem Walking  Backward Walking     Outcome Measures from 10/5 reassessment:   5xSTS 13 seconds with arms across chest   30 second STS 14 reps with arms across chest     4 stage balance test   FT,ST and T 30 second each   SLS R 5.6 sec  SLS L 2.3 sec    Modified CTSIB:    - EO Firm: 30 sec   - EC Firm: 30 sec with min postural sway    - EO Foam: 30 sec with min postural sway    - EC Foam:  7 sec, eyes opened    Gait speed: 1.16 m/s      FGA 18/30  TUG 9.4 seconds   Cog TUG 11.11 seconds ( > 10% difference)   : 1,310 ft with no rest breaks   DHI 48      Communication/consultation with other professionals: Discussed POC with PT.    Treatment Rendered:    Total Treatment Time: 48 min   PPT: 40 min   There Ex: 8 min (not billed)     I attest that I have reviewed the above information.  Signed: Maudry Diego, PT  02/01/2020 1:45 PM

## 2020-02-04 MED ORDER — AMITRIPTYLINE 25 MG TABLET
ORAL_TABLET | 5 refills | 0 days | Status: CP
Start: 2020-02-04 — End: ?

## 2020-02-04 NOTE — Unmapped (Signed)
Last Visit Date: 11/04/2019  Next Visit Date: left message to schedule appt. If needed. 02/04/2020 @ 1:04 pm.        No results found for: CBC, CMP     No results found for this or any previous visit.

## 2020-02-08 ENCOUNTER — Other Ambulatory Visit (HOSPITAL_COMMUNITY): Payer: Self-pay | Admitting: Psychiatry

## 2020-02-09 ENCOUNTER — Encounter: Admit: 2020-02-09 | Discharge: 2020-02-10 | Payer: PRIVATE HEALTH INSURANCE

## 2020-02-09 MED ADMIN — gadobenate dimeglumine (MULTIHANCE) 529 mg/mL (0.1mmol/0.2mL) solution 20 mL: 20 mL | INTRAVENOUS | @ 22:00:00 | Stop: 2020-02-09

## 2020-02-10 ENCOUNTER — Other Ambulatory Visit: Payer: Self-pay | Admitting: Student in an Organized Health Care Education/Training Program

## 2020-02-10 DIAGNOSIS — M792 Neuralgia and neuritis, unspecified: Secondary | ICD-10-CM

## 2020-02-10 DIAGNOSIS — G894 Chronic pain syndrome: Secondary | ICD-10-CM

## 2020-02-10 DIAGNOSIS — R202 Paresthesia of skin: Secondary | ICD-10-CM

## 2020-02-10 DIAGNOSIS — G35 Multiple sclerosis: Secondary | ICD-10-CM

## 2020-02-15 ENCOUNTER — Encounter: Payer: Self-pay | Admitting: Student in an Organized Health Care Education/Training Program

## 2020-02-15 ENCOUNTER — Other Ambulatory Visit: Payer: Self-pay

## 2020-02-15 ENCOUNTER — Ambulatory Visit
Payer: Medicaid Other | Attending: Student in an Organized Health Care Education/Training Program | Admitting: Student in an Organized Health Care Education/Training Program

## 2020-02-15 VITALS — BP 146/90 | HR 109 | Temp 97.9°F | Resp 16 | Ht 62.0 in | Wt 300.0 lb

## 2020-02-15 DIAGNOSIS — G894 Chronic pain syndrome: Secondary | ICD-10-CM | POA: Diagnosis not present

## 2020-02-15 DIAGNOSIS — R202 Paresthesia of skin: Secondary | ICD-10-CM | POA: Diagnosis not present

## 2020-02-15 DIAGNOSIS — G35 Multiple sclerosis: Secondary | ICD-10-CM | POA: Insufficient documentation

## 2020-02-15 DIAGNOSIS — M792 Neuralgia and neuritis, unspecified: Secondary | ICD-10-CM | POA: Diagnosis present

## 2020-02-15 MED ORDER — TRAMADOL HCL 50 MG PO TABS: 50.0000 mg | ORAL_TABLET | Freq: Every day | ORAL | 2 refills | Status: DC | PRN

## 2020-02-15 NOTE — Progress Notes (Signed)
Nursing Pain Medication Assessment:  Safety precautions to be maintained throughout the outpatient stay will include: orient to surroundings, keep bed in low position, maintain call bell within reach at all times, provide assistance with transfer out of bed and ambulation.  Medication Inspection Compliance: Natasha [name] did not comply with our request to bring her pills to be counted. She was reminded that bringing the medication bottles, even when empty, is a requirement.  Medication: None brought in. Pill/Patch Count: None available to be counted. Bottle Appearance: No container available. Did not bring bottle(s) to appointment. Filled Date: N/A Last Medication intake:  Yesterday   Pt and mother instructed to bring Tramadol container to each appointment.

## 2020-02-15 NOTE — Patient Instructions (Signed)
A prescription for Tramadol has been sent to your pharmacy. A referral to psychology was ordered.

## 2020-02-15 NOTE — Progress Notes (Signed)
PROVIDER NOTE: Information contained herein reflects review and annotations entered in association with encounter. Interpretation of such information and data should be left to medically-trained personnel. Information provided to patient can be located elsewhere in the medical record under "Patient Instructions". Document created using STT-dictation technology, any transcriptional errors that may result from process are unintentional.    Patient: Natasha [name]  Service Category: E/M  Provider: Gillis Santa, MD  DOB: [birth year]  DOS: 02/15/2020  Specialty: Interventional Pain Management  MRN: [id number]  Setting: Ambulatory outpatient  PCP: Wayland Denis, PA-C  Type: Established Patient    Referring Provider: Wayland Denis, PA-C  Location: Office  Delivery: Face-to-face     HPI  Natasha [name], a 19 y.o. year old female, is here today because of her Multiple sclerosis ([place]) [G35]. Natasha [name] primary complain today is Back Pain (mid) and Leg Pain (bilat) Last encounter: My last encounter with her was on 02/10/2020. Pertinent problems: Natasha [name] has Multiple sclerosis ([place]); Chronic pain syndrome; Neuropathic pain; Paresthesias; and Anxiety disorder on their pertinent problem list. Pain Assessment: Severity of Chronic pain is reported as a 7 /10. Location: Back Mid/denies. Onset: More than a month ago. Quality: Aching, Throbbing. Timing: Constant. Modifying factor(s): meds help some. Vitals:  height is 5' 2" (1.575 m) and weight is 300 lb (136.1 kg). Her temporal temperature is 97.9 F (36.6 C). Her blood pressure is 146/90 (abnormal) and her pulse is 109 (abnormal). Her respiration is 16 and oxygen saturation is 100%.   Reason for encounter: medication management.   Natasha [name] presents today for medication management.  Since her last visit, she did have surveillance MRI of her brain, cervical and thoracic spine.  Patient does have brain and cervical lesions that have not progressed  in size.  In regards to her thoracic spine, she does have a T12-L1 disc herniation that is causing mild irritation of bilateral S1 nerve roots.  She does endorse low back pain with radiation into her bilateral hips.  Today we spent a great deal of time discussing lifestyle changes including dietary modification and exercise.  While the patient could be a candidate for a T12-L1 epidural steroid injection, I recommend that she try and lose weight as this will help with her low back and hip pain more so than an injection at this point.  Patient became somewhat tearful during the encounter.  I believe that the patient could benefit from seeing a psychologist to help with coping skills and also provide CBT around food choices and eating behaviors.  UDS up-to-date and appropriate.  Pharmacotherapy Assessment   01/11/2020  1   11/15/2019  Tramadol Hcl 50 MG Tablet  30.00  30 Bi Lat   [id number]   Nor ([id number])   2/2  5.00 MME  Medicaid   [place]     Analgesic: Tramadol 30 mg daily as needed, quantity 30/month    Monitoring: [place] PMP: PDMP reviewed during this encounter.       Pharmacotherapy: No side-effects or adverse reactions reported. Compliance: No problems identified. Effectiveness: Clinically acceptable.  [name], RN  02/15/2020  8:21 AM  Sign when Signing Visit Nursing Pain Medication Assessment:  Safety precautions to be maintained throughout the outpatient stay will include: orient to surroundings, keep bed in low position, maintain call bell within reach at all times, provide assistance with transfer out of bed and ambulation.  Medication Inspection Compliance: Natasha [name] did not comply with our request to bring her pills to be counted. She was  reminded that bringing the medication bottles, even when empty, is a requirement.  Medication: None brought in. Pill/Patch Count: None available to be counted. Bottle Appearance: No container available. Did not bring bottle(s) to appointment. Filled  Date: N/A Last Medication intake:  Yesterday   Pt and mother instructed to bring Tramadol container to each appointment.    UDS:  Summary  Date Value Ref Range Status  08/10/2019 Note  Final    Comment:    ==================================================================== Compliance Drug Analysis, Ur ==================================================================== Test                             Result       Flag       Units Drug Present and Declared for Prescription Verification   Amitriptyline                  PRESENT      EXPECTED   Nortriptyline                  PRESENT      EXPECTED    Nortriptyline is an expected metabolite of amitriptyline.   Naproxen                       PRESENT      EXPECTED Drug Present not Declared for Prescription Verification   Ibuprofen                      PRESENT      UNEXPECTED   Diphenhydramine                PRESENT      UNEXPECTED Drug Absent but Declared for Prescription Verification   Tramadol                       Not Detected UNEXPECTED ng/mg creat   Carbamazepine                  Not Detected UNEXPECTED   Gabapentin                     Not Detected UNEXPECTED   Cyclobenzaprine                Not Detected UNEXPECTED   Acetaminophen                  Not Detected UNEXPECTED    Acetaminophen, as indicated in the declared medication list, is not    always detected even when used as directed.   Promethazine                   Not Detected UNEXPECTED ==================================================================== Test                      Result    Flag   Units      Ref Range   Creatinine              44               mg/dL      >=20 ==================================================================== Declared Medications:  The flagging and interpretation on this report are based on the  following declared medications.  Unexpected results may arise from  inaccuracies in the declared medications.  **Note: The testing scope of this  panel includes these medications:  Amitriptyline (Elavil)  Carbamazepine (Carbatrol)  Cyclobenzaprine (Flexeril)  Gabapentin (Neurontin)  Naproxen (Naprosyn)  Promethazine (Phenergan)  Tramadol (Ultram)  **Note: The testing scope of this panel does not include small to  moderate amounts of these reported medications:  Acetaminophen (Tylenol)  **Note: The testing scope of this panel does not include the  following reported medications:  Bacitracin  Cholecalciferol  Ethinyl Estradiol (Xulane)  Fingolimod  Metformin (Glucophage)  Norelgestromin (Xulane)  Ofatumumab (Kesimpta) ==================================================================== For clinical consultation, please call [phone]. ====================================================================      ROS  Constitutional: Denies any fever or chills Gastrointestinal: No reported hemesis, hematochezia, vomiting, or acute GI distress Musculoskeletal: Low back pain, bilateral hip pain, bilateral foot pain Neurological: No reported episodes of acute onset apraxia, aphasia, dysarthria, agnosia, amnesia, paralysis, loss of coordination, or loss of consciousness  Medication Review  Cholecalciferol, Ofatumumab, acetaminophen, amitriptyline, cyclobenzaprine, naproxen, promethazine, traMADol, and traZODone  History Review  Allergy: Natasha [name] has No Known Allergies. Drug: Natasha [name]  reports no history of drug use. Alcohol:  reports no history of alcohol use. Tobacco:  reports that she has never smoked. She has never used smokeless tobacco. Social: Natasha [name]  reports that she has never smoked. She has never used smokeless tobacco. She reports that she does not drink alcohol and does not use drugs. Medical:  has a past medical history of Multiple sclerosis ([place]) (06/2014). Surgical: Natasha [name]  has no past surgical history on file. Family: family history includes ADD / ADHD in her brother and mother;  Alcohol abuse in her maternal grandfather and maternal grandmother; Depression in her mother.  Laboratory Chemistry Profile   Renal Lab Results  Component Value Date   BUN 14 04/29/2019   CREATININE 0.50 04/29/2019   GFRAA >60 04/29/2019   GFRNONAA >60 04/29/2019     Hepatic No results found for: AST, ALT, ALBUMIN, ALKPHOS, HCVAB, AMYLASE, LIPASE, AMMONIA   Electrolytes Lab Results  Component Value Date   NA 137 04/29/2019   K 3.8 04/29/2019   CL 104 04/29/2019   CALCIUM 8.8 (L) 04/29/2019     Bone No results found for: VD25OH, CB449QP5FFM, BW4665LD3, TT0177LT9, 25OHVITD1, 25OHVITD2, 25OHVITD3, TESTOFREE, TESTOSTERONE   Inflammation (CRP: Acute Phase) (ESR: Chronic Phase) No results found for: CRP, ESRSEDRATE, LATICACIDVEN     Note: Above Lab results reviewed.  Recent Imaging Review    CLINICAL INDICATION: 19 years old Female with MS - G35 - Multiple sclerosis (CMS - HCC)    COMPARISON: None   TECHNIQUE: Multiplanar MRI was performed through the thoracic spine prior to and following intravenous contrast administration.   FINDINGS:   T8-T9 demonstrates a small right posterior lateral disc protrusion. This produces a mild indentation on the adjacent right side of the spinal cord. Findings are equivocal in regard to affecting the adjacent right T9 nerve root. Neural foramen show no significant abnormality.   T10-T11 demonstrates a left paracentral disc protrusion/extrusion of mild to moderate size. This disc protrusion does produce indentation on the adjacent spinal cord and probably does affect the left T11 nerve rootlet. Mild increased signal is noted in the spinal cord at this level especially on the sagittal STIR sequence suggesting mild cord edema or myelomalacia at this level.   T11-T12 demonstrates a small right posterior lateral disc protrusion. This does not affect the adjacent spinal cord or nerve rootlets. Neural foramen appear normal.   T12-L1 demonstrates a  prominent paracentral disc protrusion/extrusion producing a prominent indentation on the adjacent anterior aspect of the spinal cord and displacing it posteriorly. This  disc protrusion/extrusion most likely affects both L1 nerve rootlets and produces a moderate degree of central spinal canal stenosis.   The paraspinal tissues are within normal limits. Procedure Note  Interface, Rad Results In - 02/10/2020 9:36 AM EDT  Formatting of this note might be different from the original.  EXAM: Magnetic resonance imaging, thoracic spine, without and with contrast.  DATE: 02/09/2020 5:44 PM  ACCESSION: [id number]  DICTATED: 02/10/2020 7:40 AM  INTERPRETATION LOCATION: [place]   CLINICAL INDICATION: 19 years old Female with MS - G35 - Multiple sclerosis (CMS - HCC)    COMPARISON: None   TECHNIQUE: Multiplanar MRI was performed through the thoracic spine prior to and following intravenous contrast administration.   FINDINGS:   T8-T9 demonstrates a small right posterior lateral disc protrusion. This produces a mild indentation on the adjacent right side of the spinal cord. Findings are equivocal in regard to affecting the adjacent right T9 nerve root. Neural foramen show no significant abnormality.   T10-T11 demonstrates a left paracentral disc protrusion/extrusion of mild to moderate size. This disc protrusion does produce indentation on the adjacent spinal cord and probably does affect the left T11 nerve rootlet. Mild increased signal is noted in the spinal cord at this level especially on the sagittal STIR sequence suggesting mild cord edema or myelomalacia at this level.   T11-T12 demonstrates a small right posterior lateral disc protrusion. This does not affect the adjacent spinal cord or nerve rootlets. Neural foramen appear normal.   T12-L1 demonstrates a prominent paracentral disc protrusion/extrusion producing a prominent indentation on the adjacent anterior aspect of the spinal cord  and displacing it posteriorly. This disc protrusion/extrusion most likely affects both L1 nerve rootlets and produces a moderate degree of central spinal canal stenosis.   The paraspinal tissues are within normal limits.     Note: Reviewed        Physical Exam  General appearance: alert, cooperative and moderately obese Mental status: Alert, oriented x 3 (person, place, & time)       Respiratory: No evidence of acute respiratory distress Eyes: PERLA Vitals: BP (!) 146/90   Pulse (!) 109   Temp 97.9 F (36.6 C) (Temporal)   Resp 16   Ht 5' 2" (1.575 m)   Wt 300 lb (136.1 kg)   LMP 12/16/2019 (Approximate)   SpO2 100%   BMI 54.87 kg/m  BMI: Estimated body mass index is 54.87 kg/m as calculated from the following:   Height as of this encounter: 5' 2" (1.575 m).   Weight as of this encounter: 300 lb (136.1 kg). Ideal: Ideal body weight: 110 lb 7.2 oz (50.1 kg) Adjusted ideal body weight: 186 lb 4.3 oz (84.5 kg)   Thoracic Spine Area Exam  Skin & Axial Inspection: No masses, redness, or swelling Alignment: Symmetrical Functional ROM: Pain restricted ROM Stability: No instability detected Muscle Tone/Strength: Functionally intact. No obvious neuro-muscular anomalies detected. Sensory (Neurological): Musculoskeletal pain pattern Muscle strength & Tone: No palpable anomalies Lumbar Spine Area Exam  Skin & Axial Inspection: No masses, redness, or swelling Alignment: Symmetrical Functional ROM: Pain restricted ROM       Stability: No instability detected Muscle Tone/Strength: Functionally intact. No obvious neuro-muscular anomalies detected. Sensory (Neurological): Musculoskeletal pain pattern Palpation: No palpable anomalies       Provocative Tests: Hyperextension/rotation test: (+) bilaterally for facet joint pain. Lumbar quadrant test (Kemp's test): deferred today       Lateral bending test: deferred today  Patrick's Maneuver: deferred today                   FABER*  test: deferred today                   S-I anterior distraction/compression test: deferred today         S-I lateral compression test: deferred today         S-I Thigh-thrust test: deferred today         S-I Gaenslen's test: deferred today         *(Flexion, ABduction and External Rotation)   Assessment   Status Diagnosis  Controlled Controlled Controlled 1. Multiple sclerosis (HCC)   2. Paresthesias   3. Neuropathic pain   4. Chronic pain syndrome       Plan of Care   Natasha [name] has a current medication list which includes the following long-term medication(s): amitriptyline, promethazine, tramadol, and trazodone.  1.  Discussed weight loss strategies and healthy eating.  Referral to psychology as below to consider CBT regarding dietary modification and eating behavior 2.  Refill tramadol as below, only to be takenfor severe breakthrough pain related to multiple sclerosis. 3.  Consider T12-L1 ESI for L1 radicular pain based upon clinical findings and thoracic MRI.  Pharmacotherapy (Medications Ordered): Meds ordered this encounter  Medications  . traMADol (ULTRAM) 50 MG tablet    Sig: Take 1 tablet (50 mg total) by mouth daily as needed.    Dispense:  30 tablet    Refill:  2   Orders:  Orders Placed This Encounter  Procedures  . Ambulatory referral to Psychology    Referral Priority:   Routine    Referral Type:   Psychiatric    Referral Reason:   Specialty Services Required    Referred to Provider:   Renaee Munda, PhD    Requested Specialty:   Psychology    Number of Visits Requested:   1   Follow-up plan:   Return in about 3 months (around 05/17/2020) for Medication Management, in person.   Recent Visits No visits were found meeting these conditions. Showing recent visits within past 90 days and meeting all other requirements Today's Visits Date Type Provider Dept  02/15/20 Office Visit Gillis Santa, MD [facility]  Showing today's  visits and meeting all other requirements Future Appointments Date Type Provider Dept  05/11/20 Appointment Gillis Santa, MD [facility]  Showing future appointments within next 90 days and meeting all other requirements  I discussed the assessment and treatment plan with the patient. The patient was provided an opportunity to ask questions and all were answered. The patient agreed with the plan and demonstrated an understanding of the instructions.  Patient advised to call back or seek an in-person evaluation if the symptoms or condition worsens.  Duration of encounter: 30 minutes.  Note by: Gillis Santa, MD Date: 02/15/2020; Time: 8:57 AM

## 2020-02-19 DIAGNOSIS — G35 Multiple sclerosis: Principal | ICD-10-CM

## 2020-02-19 MED ORDER — BUPROPION HCL XL 300 MG 24 HR TABLET, EXTENDED RELEASE
ORAL_TABLET | Freq: Every morning | ORAL | 2 refills | 30 days | Status: CP
Start: 2020-02-19 — End: ?

## 2020-02-21 ENCOUNTER — Ambulatory Visit
Admit: 2020-02-21 | Payer: PRIVATE HEALTH INSURANCE | Attending: Rehabilitative and Restorative Service Providers" | Primary: Rehabilitative and Restorative Service Providers"

## 2020-02-25 NOTE — Unmapped (Signed)
[facility] Refill Coordination Note    Specialty Medication(s) to be Shipped:   Neurology: Kesimpta    Other medication(s) to be shipped: No additional medications requested for fill at this time     Donna Mata, DOB: [birth year]  Phone: [phone] (home)       All above HIPAA information was verified with patient.     Was a Nurse, learning disability used for this call? No    Completed refill call assessment today to schedule patient's medication shipment from the [facility] [phone]).       Specialty medication(s) and dose(s) confirmed: Regimen is correct and unchanged.   Changes to medications: Donna Mata reports no changes at this time.  Changes to insurance: No  Questions for the pharmacist: No    Confirmed patient received Welcome Packet with first shipment. The patient will receive a drug information handout for each medication shipped and additional FDA Medication Guides as required.       DISEASE/MEDICATION-SPECIFIC INFORMATION        For patients on injectable medications: Patient currently has 0 doses left.  Next injection is scheduled for 11.3.    SPECIALTY MEDICATION ADHERENCE     Medication Adherence    Patient reported X missed doses in the last month: 0  Specialty Medication: Kesimpta  Patient is on additional specialty medications: No  Patient is on more than two specialty medications: No  Any gaps in refill history greater than 2 weeks in the last 3 months: no  Demonstrates understanding of importance of adherence: yes  Informant: patient   Other adherence tool: It is part of patient's routine.                 Kesimpta 20mg /0.11ml: Patient has 0 days of medication on hand      SHIPPING     Shipping address confirmed in Epic.     Delivery Scheduled: Yes, Expected medication delivery date: 11/2.     Medication will be delivered via UPS to the prescription address in Epic WAM.    Donna Mata   [facility] Specialty Technician

## 2020-02-28 MED FILL — KESIMPTA PEN 20 MG/0.4 ML SUBCUTANEOUS PEN INJECTOR: 84 days supply | Qty: 1 | Fill #1 | Status: AC

## 2020-02-28 MED FILL — KESIMPTA PEN 20 MG/0.4 ML SUBCUTANEOUS PEN INJECTOR: 84 days supply | Qty: 1.2 | Fill #1

## 2020-02-29 ENCOUNTER — Ambulatory Visit
Admit: 2020-02-21 | Payer: PRIVATE HEALTH INSURANCE | Attending: Rehabilitative and Restorative Service Providers" | Primary: Rehabilitative and Restorative Service Providers"

## 2020-02-29 ENCOUNTER — Ambulatory Visit: Admit: 2020-02-21 | Payer: PRIVATE HEALTH INSURANCE

## 2020-02-29 ENCOUNTER — Ambulatory Visit
Admit: 2020-02-29 | Payer: PRIVATE HEALTH INSURANCE | Attending: Rehabilitative and Restorative Service Providers" | Primary: Rehabilitative and Restorative Service Providers"

## 2020-03-06 NOTE — Unmapped (Signed)
[facility]  [address] [place], [place] [address]    [phone]    Donna Mata did not show for her scheduled Physical Therapy follow-up session. Pt called to f/u, and she reports that she was unable to attend due to transpiration issues. Pt reports that she cannot attend therapy unless she has PT, OT, and ST appointments scheduled for the same day due to need to borrow car. Agreed to let scheduling staff know. Agreed that patient will f/u on 11/30. Cancelled all PT appointments prior to that date per patient request.  Please contact me if you have any questions or concerns.    Thank you for this referral,     Signed: Eulas Post, PT  03/06/2020 2:38 PM

## 2020-03-13 ENCOUNTER — Other Ambulatory Visit (HOSPITAL_COMMUNITY): Payer: Self-pay | Admitting: Psychiatry

## 2020-03-20 NOTE — Unmapped (Signed)
patient has enough medication on hand, rescheduling refill call for 1/10

## 2020-03-28 ENCOUNTER — Ambulatory Visit
Admit: 2020-03-28 | Payer: PRIVATE HEALTH INSURANCE | Attending: Speech-Language Pathologist | Primary: Speech-Language Pathologist

## 2020-04-04 ENCOUNTER — Encounter: Admit: 2020-04-04 | Discharge: 2020-05-01 | Payer: PRIVATE HEALTH INSURANCE

## 2020-04-04 ENCOUNTER — Encounter
Admit: 2020-04-04 | Discharge: 2020-05-01 | Payer: PRIVATE HEALTH INSURANCE | Attending: Rehabilitative and Restorative Service Providers" | Primary: Rehabilitative and Restorative Service Providers"

## 2020-04-04 ENCOUNTER — Encounter: Admit: 2020-04-04 | Discharge: 2020-05-03 | Payer: PRIVATE HEALTH INSURANCE

## 2020-04-04 DIAGNOSIS — R1310 Dysphagia, unspecified: Principal | ICD-10-CM

## 2020-04-04 DIAGNOSIS — M792 Neuralgia and neuritis, unspecified: Principal | ICD-10-CM

## 2020-04-04 DIAGNOSIS — R41841 Cognitive communication deficit: Principal | ICD-10-CM

## 2020-04-04 DIAGNOSIS — G35 Multiple sclerosis: Principal | ICD-10-CM

## 2020-04-04 DIAGNOSIS — R5383 Other fatigue: Principal | ICD-10-CM

## 2020-04-04 DIAGNOSIS — R42 Dizziness and giddiness: Principal | ICD-10-CM

## 2020-04-04 DIAGNOSIS — E119 Type 2 diabetes mellitus without complications: Principal | ICD-10-CM

## 2020-04-04 DIAGNOSIS — R2681 Unsteadiness on feet: Principal | ICD-10-CM

## 2020-04-04 NOTE — Unmapped (Signed)
[facility] [address] [place]  OUTPATIENT SPEECH PATHOLOGY  04/04/2020      Patient Name: Donna Mata [facility]  Date of Birth:01/24/2001  Session Number: 2  Diagnosis:   Encounter Diagnoses   Name Primary?   ??? Cognitive communication deficit Yes   ??? Multiple sclerosis (CMS-HCC)    ??? Dysphagia, unspecified type            Date of Evaluation: 11/04/19     Referred by: Mathews Argyle, MD         Dates of Certification: 12 visits 10/28/2019-04/18/2020 mangaged medicaid    Chief Complaint: cognitive-linguistic changes in the setting of MS    Note Type: Treatment Note    ASSESSMENT:     Next Visit Plan: Continue per POC    OBJECTIVE:  Pt was seen for OP ST to address aforementioned goals. Today's session focused on provision of written and verbal education re: energy conservation/fatigue management, attention and distraction management, short term memory and related compensatory strategies and word finding strategies. SLP additionally reviewed compensatory swallow strategies related to small bites/sips and slow rate and educated pt on completing effortful swallow exercise. Pt was able to return demonstration. She notes that she is continuing to have intermittent difficulty with swallowing but compensates with taking smaller bites. Discussed HEP of determining early signs of fatigue and using compensatory strategies.      Stimulability: Pt was very stimulable  Treatment Recommendations: Continue Treatment    PLAN:  SLP Follow-up / Frequency: 1x week for Planned Treatment Duration : 12 weeks      Planned Interventions: Cognitive-Communication Treatment,Compensatory Strategy Training,Metacognitive Skills Training,Multimodal Treatment,Patient education,Skilled Memory Training OP ST    Prognosis:  Good    Negative Prognosis Rationale:  (nature of diagnosis)       Positive Prognosis Rationale: Age,Motivation,Good caregiver/family support      Goals:        STG 1: Pt will tolerate regular diet and thin liquids with no s/sx of aspiration.    STG 2: Pt will complete at least 30 repetitions of oropharyngeal exercises independently.    STG 3: Pt will complete immediate, working and delayed memory tasks with 90% accuracy given minimal cues.    STG 4: Pt will complete attention tasks (visual and auditory) with 90% accuracy given minimal cues.    STG 5: Pt will complete tasks to address processing speed with 90% accuracy given minimal cues.    STG 6: Pt will demonstrate understanding of at least 3 energy conservation/fatigue management techniques.              Time Frame: 12  Duration: weeks    LTG #1: Pt will increase independence with cognitive-linguistic skills through use of compensatory strategies as evidenced by objective data and/or pt report.    LTG #2: Pt will tolerate regular diet/thin liquids without s/sx of aspiration through use of compensatory strategies.                                  Time Frame: 12  Duration: weeks       SUBJECTIVE:  Pt was pleasant and engaged. Seen following PT and OT. Reports she has finished online classes for this semester and did well in some courses but struggled with [place]. SLPs and pt donned facemasks. SLPs additionally donned eye protection.  Pain?: Yes   Comments: COVID vaccination site in right arm  Precautions:  Falls      Education Provided: Patient,SLP Plan of Care,Compensatory Swallowing Strategies,Importance of Therapy,Strategies to maximize attention and memory    Response to Education: Understanding demonstrated     Communication/Consultation: n/a    Session Duration : 45    Today's Charges (noted here with $$):  SLP Treatment Charges  $$ Treatment S/L/V - individual [mins]: 45                   I attest that I have reviewed the above information.  Signed: Ilda Mori, SLP  04/04/2020 10:35 AM

## 2020-04-04 NOTE — Unmapped (Signed)
OUTPATIENT OCCUPATIONAL THERAPY   Note Type: Treatment Note         Patient Name: Donna Mata  Date of Birth:2000/05/09  Date: 04/04/2020  Visit #:  3  Referring Physician: Champ Mata, Ire*  Date of Onset of Injury: 2016   Date of Evaluation: 11/04/19  Plan of Care: medicaid 10 visits remaining   Encounter Diagnoses   Name Primary?   ??? Multiple sclerosis (CMS-HCC) Yes   ??? Type 2 diabetes mellitus without complication, without long-term current use of insulin (CMS-HCC)    ??? Neuropathic pain    ??? Fatigue, unspecified type        ASSESSMENT  Patient is a 19 y.o. year old female with history of Multiple Sclerosis, Type 2 Diabetes (diagnosis in the chart but patient reports it is pre-diabetes), generalized fatigue, weakness and apathy at times who presents with difficulty to complete basic ADLs as well as IADLs.  Patient has finished up her classes for the semester and passed on 3/4 classes.  She is signed up for 4 classes next semester.  She struggled with English because it was difficult to read a comic book possibly due to understanding and visual concerns.  Patient has progressed in fine motor performance but has not yet met the goals.  Established a reading goal this date.  Patient requires skilled outpatient Occupational Therapy services to address the above deficits and maximize independence with ADLs and IADLs in home environment.    After review of pt's occupational profile and history, assessment of occupational performance, clinical decision making, and development of POC, pt presents as a moderate complexity case.     Patient wore a mask for the entire therapy session., Patient's companion wore a mask for the entire therapy session., Therapist wore a mask for the entire session.  and Therapist wore eye goggles/frames during the entire session.        Goals  1. In 4 visits, patient will perform home exercise program with need for cuing to max IND with ADLs and IADLs.  2. In 4 visits, patient will report at least 1 compensatory strategy to improve school work participation and endurance (MET completed 3/4 classes this semesters).  3. In 4 visits, patient will report at least 1 energy conservation strategy to improve cooking and cleaning participation (Progressing, not cooking as much currently, and doing more clean up related to dog care).   4. In 4 visits, patient will improve her fine motor coordination as assessed by 9 hole peg test to Right 32 secs and Left 31secs or better.  5. In 4 visits, patient will participation in further visual assessment (MET).  New Goal:  Patient will complete Pepper Reading test with corrected reading rate at 60 WPM or better for improved school performance.       Patient in agreement with plan of care?: yes    Prognosis for goal achievement: Good due to good motivation and supportive family.    PLAN  Pt. will participate in:  Therapeutic Exercise  Therapeutic Acitivity  Neuromuscular Re-education  Self-care home training    Planned frequency and duration of treatment:  1x every other week for 4 weeks.  Plan will be adjusted as needed.     Past Medical History:   Past Medical History:   Diagnosis Date   ??? Hematuria 2006    resolved   ??? Migraine 10/12/2018   ??? MS (multiple sclerosis) (CMS-HCC) 07/17/2014   ??? Obesity    ??? Perinatal subependymal hemorrhage  2002    confirmed by [place]   ??? Type 2 diabetes mellitus without complication (CMS-HCC) 06/28/2019   ??? Vitamin D deficiency 2016       Past Surgical History:   Past Surgical History:   Procedure Laterality Date   ??? NO PAST SURGERIES         SUBJECTIVE    Can be on the computer for about 30 minutes before needing a break      History of Present Condition:   Per PA Note:  Relapsing Remitting Multiple Sclerosis.  -Started Kesimpta 04/12/2019.  -Reviewed labs from OSH from 09/03/2019 which are located in Care Everywhere.  -Keep MRI appointment on 10/11/2019.  ??  **Cognitive decline: Refer to [facility] speech for testing and treatment plan.  **Fatigue: Start Amantadine 100mg , May take one two capsuls first thing in the morning. If needed repeat between 12-2pm.  **Polypharmacy: Followed by pain management and PCP who started patient on Tramodol 50mg  prn ,Trazodone 100mg  at bedtime and Flexeril 5mg  prn along with Amitriptyline 25mg  at bedtime and Phenergan 12.5mg  prn  that I prescribe. Discussed this with patient and mother .   Advised that if she feels like Amitriptyline is not working and wishes to discontinue it that she will need to decrease by 25% of the daily dose every 2 weeks. My chart message from patient and she confirms that she is taking Amitriptyline 75mg  at bedtime and wishes to continue on this dose.  ??  **Improved right ear symptoms. Continue to monitor.  ??  -Follow up in 3 months. Discuss the timing of the COVID-19 vaccine.    Precautions: fall risk, memory concerns     Prior Therapies: OT in hospital and post 5 years prior     Patient???s Goals: to finish school and obtain job    Pain  Pain present?: yes;  Location: bilateral knees shooting pain    Frequency/Duration: continuously  Intensity:  5 /10 today.   Aggravating Factors: walking for longer   Relieving Factors:meds helps to reduce     Social History:   Lives with mom Donna Mata   1 level home 3 steps to enter      Tub shower 1 installed and 2 suction cups   has a shower chair in storage   Regular toilet no concerns         2 adult cats and kitten     OBJECTIVE    OT Performance Testing 25 mins:  The Pepper Visual Skills for Reading Test provides an accurate and reliable estimate of a reader???s ability in the visual components of the reading process.   Form: I  Size: 1  Total Correct: 108  Mean Percent Correct: 98%  Test time: 1.87   Corrected Reading Rate: 57.8    Coordination  Nine Hole Peg (Sec) RIGHT 33.84 secs LEFT 29.95 secs  Age 3-24 R 12-20 L 12-22     Therapuetic Exercise 15 mins:  Completed theraputty red to find 10 red balls, break into individual balls and lastly Transitioned into [place] Tweezer Dexterity Test to place pegs in and remove with tweezers      Total Treatment Time:  40 minutes    Education:  Topics:Disease Process   OT POC  Education Provided to: patient and mother  Education Type:Explanation  Response to education/teachback:Verbal understanding recieved    I attest that I have reviewed the above information.  Signed: Dierdre Harness, OT  04/04/2020 8:38 AM

## 2020-04-04 NOTE — Unmapped (Signed)
NEUROLOGIC OUTPATIENT PHYSICAL THERAPY  Discharge Note      Patient Name: Donna Mata  Date of Birth:2000/08/28  Date: 04/04/2020  Session Number:  3   Therapy Diagnosis:   Encounter Diagnoses   Name Primary?   ??? Multiple sclerosis (CMS-HCC) Yes   ??? Dizziness    ??? Gait instability        Date of Injury/Onset: 07/27/2014  Date of Evaluation: 11/04/2019  Referring Pracitioner: Champ Mungo, Ire*   Certification Dates: current authorization window 11/17/19 - 02/27/20 (12 visits); Landis Martins messaged on 02/01/20 regarding extending authorization window through 04/28/2020     Patient removed their mask during the session due to drinking water, Therapist wore a mask for the entire session.  and Therapist wore eye goggles/frames during the entire session.     ASSESSMENT:    19 y.o. female with RRMS presents with dizziness, gait instability, decreased hip/core strength, and decreased endurance which was limiting her safety with household and community ambulation and ability to fully and safely participate in return to exercise activities. Today's session focused on reassessment to determine progress towards meeting functional goals and appropriateness for discharge. Pt met or made progress towards all of her functional goals. Pt's DHI of 48 indicates 48% perceived disability due to dizziness and she may benefit from vestibular assessment and treatment. Will contact referring provider regarding vestibular PT referral. Due to progress with balance and strength, pt has been discharged from skilled PT to pursue vestibular PT.     Goals   Patient/Family Goals: reduce falls, improve balance   ??  Short Term Goals:  In 4 weeks:  Patient will be able to properly demonstrate current HEP independently x1 in clinic to build upon functional gains in PT.   Patient will perform STS transfers with no need to reposition feet for balance upon standing and SBA or less assistance to increase independence and safety with transfers. MET 10/5  Patient will complete TUG and CogTUG testing, as well as to further inform care. MET 10/5  ??  Long Term Goals:  12 weeks:  Patient will be able to properly demonstrate current HEP independently x1 in clinic to build upon functional gains in PT.   Patient will increase SLS balance to >10s Bilaterally to improve safety with community ambulation and decrease future risk of falls. MET 12/7  Patient will ascend and descend 3 stairs with no assistance, with 1 to no handrails and safe technique demonstrating step-through pattern in order to increase independence with household/community mobility. MET 12/7  Patient will improve to 1,460 ft or more to show significantly improved endurance for increased independence with community ambulation. Progress 12/7  Patient will ambulate 1.2 m/s or faster with LRAD mod-I to demonstrate increased safety with community ambulation, such as crossing streets at crosswalks. MET 12/7  Patient will improve FGA to 23/30 or more to show further reduction in falls risk. MET 12/7  Patient will improve feet together eyes closed on foam to 15 seconds or more to show improved interpretation of vestibular cues. MET 12/7  Further vestibular goals to be added upon vestibular assessment    PLAN:  Discharge from skilled PT to pursue vestibular PT     SUBJECTIVE:  Pt states: that school has been going well and she has been able to keep up with her exercises. Pt states no falls or near falls.     Reason for Referral/History of Present Condition/Onset of injury/exacerbation:   19 y.o. female presents to  the [facility] with impaired balance and decreased hip/core strength with frequent falls consistent with pt's RRMS symptoms    OBJECTIVE:  Pain: 5/10 below the knee on the L leg     PPT: 40 min     5xSTS 10 seconds with arms across chest     30 second STS 14 reps with arms across chest     4 stage balance test   FT 30 seconds   ST with R leading 30 sec  ST with L leading 30 sec  T with R leading 30 sec  T with L leading 30 sec   SLS R 12 sec  SLS L 9.6 sec    -Modified CTSIB:    - EO Firm: 30 sec   - EC Firm: 30 sec with min postural sway    - EO Foam: 30 sec with min postural sway    - EC Foam:  30 sec, with min postural sway     Gait speed:  8 seconds: 1.25 m/s      1,375 ft     Functional Gait Assessment    Requirements: A marked 6-m (20-ft) walkway that is marked with a 30.48-cm (12-in) width.    1. GAIT LEVEL SURFACE  3 ( 5 seconds)   Instructions: Walk at your normal speed from here to the next mark (6 m[20 ft]).  Grading: Loraine Leriche the highest category that applies.  (3) Normal???Walks 6 m (20 ft) in less than 5.5 seconds, no assistive devices, good speed, no evidence for imbalance, normal gait  pattern, deviates no more than 15.24 cm (6 in) outside of the 30.48-cm (12-in) walkway width.  (2) Mild impairment???Walks 6 m (20 ft) in less than 7 seconds but greater than 5.5 seconds, uses assistive device, slower speed,  mild gait deviations, or deviates 15.24???25.4 cm (6???10 in) outside of the 30.48-cm (12-in) walkway width.  (1) Moderate impairment???Walks 6 m (20 ft), slow speed, abnormal gait pattern, evidence for imbalance, or deviates 25.4???  38.1 cm (10???15 in) outside of the 30.48-cm (12-in) walkway width. Requires more than 7 seconds to ambulate 6 m (20 ft).  (0) Severe impairment???Cannot walk 6 m (20 ft) without assistance, severe gait deviations or imbalance, deviates greater than 38.1  cm (15 in) outside of the 30.48-cm (12-in) walkway width or reaches and touches the wall.    2. CHANGE IN GAIT SPEED 3  Instructions: Begin walking at your normal pace (for 1.5 m [5 ft]). When I tell you ???go,??? walk as fast as you can (for 1.5 m [5 ft]). When I tell you ???slow,??? walk as slowly as you can (for 1.5 m [5 ft]).  Grading: Loraine Leriche the highest category that applies.  (3) Normal???Able to smoothly change walking speed without loss of balance or gait deviation. Shows a significant difference in  walking speeds between normal, fast, and slow speeds. Deviates no more than 15.24 cm (6 in) outside of the 30.48-cm (12-in) walkway width.  (2) Mild impairment???Is able to change speed but demonstrates mild gait deviations, deviates 15.24???25.4 cm (6???10 in) outside  of the 30.48-cm (12-in) walkway width, or no gait deviations but unable to achieve a significant change in velocity, or uses an  assistive device.  (1) Moderate impairment???Makes only minor adjustments to walking speed, or accomplishes a change in speed with significant  gait deviations, deviates 25.4???38.1 cm (10???15 in) outside the 30.48-cm (12-in) walkway width, or changes speed but loses balance but is able to recover and continue  walking.  (0) Severe impairment???Cannot change speeds, deviates greater than 38.1 cm (15 in) outside 30.48-cm (12-in) walkway width,  or loses balance and has to reach for wall or be caught.    3. GAIT WITH HORIZONTAL HEAD TURNS 3 (reports little dizzy)   Instructions: Walk from here to the next mark 6 m (20 ft) away. Begin walking at your normal pace. Keep walking straight; after 3 steps, turn your head to the right and keep walking straight while looking to the right. After 3 more steps, turn your head to the left and keep walking straight while looking left. Continue alternating looking right and left every 3 steps until you have completed 2 repetitions in each direction.  Grading: Loraine Leriche the highest category that applies.  (3) Normal???Performs head turns smoothly with no change in gait. Deviates no more than 15.24 cm (6 in) outside 30.48-cm (12-in)  walkway width.  (2) Mild impairment???Performs head turns smoothly with slight change in gait velocity (eg, minor disruption to smooth gait path), deviates 15.24???25.4 cm (6???10 in) outside 30.48-cm (12-in) walkway width, or uses an assistive device.  (1) Moderate impairment???Performs head turns with moderate change in gait velocity, slows down, deviates 25.4???38.1 cm  (10???15 in) outside 30.48-cm (12-in) walkway width but recovers,  can continue to walk.  (0) Severe impairment???Performs task with severe disruption of gait (eg, staggers 38.1 cm [15 in] outside 30.48-cm (12-in) walkway  width, loses balance, stops, or reaches for wall).    4. GAIT WITH VERTICAL HEAD TURNS 3 ( reports little dizzy)  Instructions: Walk from here to the next mark (6 m [20 ft]). Begin walking at your normal pace. Keep walking straight; after 3 steps, tip your head up and keep walking straight while looking up. After 3 more steps, tip your head down, keep walking straight while looking down. Continue alternating looking up and down every 3 steps until you have completed 2 repetitions in each direction.  Grading: Loraine Leriche the highest category that applies.  (3) Normal???Performs head turns with no change in gait. Deviates no more than 15.24 cm (6 in) outside 30.48-cm (12-in) walkway  width.  (2) Mild impairment???Performs task with slight change in gait velocity (eg, minor disruption to smooth gait path), deviates 15.24???25.4 cm (6???10 in) outside 30.48-cm (12-in) walkway width or uses assistive device.  (1) Moderate impairment???Performs task with moderate change in gait velocity, slows down, deviates 25.4???38.1 cm (10???15 in)  outside 30.48-cm (12-in) walkway width but recovers, can continue to walk.  (0) Severe impairment???Performs task with severe disruption of gait (eg, staggers 38.1 cm [15 in] outside 30.48-cm (12-in) walkway  width, loses balance, stops, reaches for wall).    5. GAIT AND PIVOT TURN 3   Instructions: Begin with walking at your normal pace. When I tell you, ???turn and stop,??? turn as quickly as you can to face the opposite direction and stop.  Grading: Loraine Leriche the highest category that applies.  (3) Normal???Pivot turns safely within 3 seconds and stops quickly with no loss of balance.  (2) Mild impairment???Pivot turns safely in _3 seconds and stops with no loss of balance, or pivot turns safely within 3 seconds  and stops with mild imbalance, requires small steps to catch balance.  (1) Moderate impairment???Turns slowly, requires verbal cueing, or requires several small steps to catch balance following turn and  stop.  (0)Severe impairment???Cannot turn safely, requires assistance to turn and stop.    6. STEP OVER OBSTACLE 2   Instructions: Begin walking at your  normal speed. When you come to the shoe box, step over it, not around it, and keep walking.  Grading: Loraine Leriche the highest category that applies.  (3) Normal???Is able to step over 2 stacked shoe boxes taped together (22.86 cm [9 in] total height) without changing gait speed; no evidence of imbalance.  (2) Mild impairment???Is able to step over one shoe box (11.43 cm [4.5 in] total height) without changing gait speed; no evidence  of imbalance.  (1) Moderate impairment???Is able to step over one shoe box (11.43 cm [4.5 in] total height) but must slow down and adjust steps to  clear box safely. May require verbal cueing.  (0) Severe impairment???Cannot perform without assistance.     7. GAIT WITH NARROW BASE OF SUPPORT 1 (6 steps, stable)   Instructions: Walk on the floor with arms folded across the chest, feet aligned heel to toe in tandem for a distance of 3.6 m [12 ft]. The number of steps taken in a straight line are counted for a maximum of 10 steps.  Grading: Loraine Leriche the highest category that applies.  (3) Normal???Is able to ambulate for 10 steps heel to toe with no staggering.  (2) Mild impairment???Ambulates 7???9 steps.  (1) Moderate impairment???Ambulates 4???7 steps.  (0) Severe impairment???Ambulates less than 4 steps heel to toe or cannot perform without assistance.    8. GAIT WITH EYES CLOSED 1 ( veering towards R)   Instructions: Walk at your normal speed from here to the next mark (6 m[20 ft]) with your eyes closed.  Grading: Loraine Leriche the highest category that applies.  (3) Normal???Walks 6 m (20 ft), no assistive devices, good speed, no evidence of imbalance, normal gait pattern, deviates no more  than 15.24 cm (6 in) outside 30.48-cm (12-in) walkway width. Ambulates 6 m (20 ft) in less than 7 seconds.  (2) Mild impairment???Walks 6 m (20 ft), uses assistive device, slower speed, mild gait deviations, deviates 15.24???25.4 cm  (6???10 in) outside 30.48-cm (12-in) walkway width. Ambulates 6 m (20 ft) in less than 9 seconds but greater than 7 seconds.  (1) Moderate impairment???Walks 6 m (20 ft), slow speed, abnormal gait pattern, evidence for imbalance, deviates 25.4???38.1  cm (10???15 in) outside 30.48-cm (12-in) walkway width. Requires more than 9 seconds to ambulate 6 m (20 ft).  (0) Severe impairment???Cannot walk 6 m (20 ft) without assistance, severe gait deviations or imbalance, deviates greater than 38.1  cm (15 in) outside 30.48-cm (12-in) walkway width or will not attempt task.    9. AMBULATING BACKWARDS 2   Instructions: Walk backwards until I tell you to stop.  Grading: Loraine Leriche the highest category that applies.  (3) Normal???Walks 6 m (20 ft), no assistive devices, good speed, no evidence for imbalance, normal gait pattern, deviates no  more than 15.24 cm (6 in) outside 30.48-cm (12-in) walkway width.  (2) Mild impairment???Walks 6 m (20 ft), uses assistive device, slower speed, mild gait deviations, deviates 15.24???25.4 cm (6???10 in) outside 30.48-cm (12-in) walkway width.  (1) Moderate impairment???Walks 6 m (20 ft), slow speed, abnormal gait pattern, evidence for imbalance, deviates 25.4???38.1  cm (10???15 in) outside 30.48-cm (12-in) walkway width.  (0) Severe impairment???Cannot walk 6 m (20 ft) without assistance, severe gait deviations or imbalance, deviates greater than 38.1  cm (15 in) outside 30.48-cm (12-in) walkway width or will not attempt task.    10. STEPS 1  Instructions: Walk up these stairs as you would at home (ie, using the rail if necessary). At the  top turn around and walk down.  Grading: Loraine Leriche the highest category that applies.  (3) Normal???Alternating feet, no rail.  (2) Mild impairment???Alternating feet, must use rail.  (1) Moderate impairment???Two feet to a stair; must use rail.  (0) Severe impairment???Cannot do safely.    TOTAL SCORE: 23  MAXIMUM SCORE 30    Physical Therapy . Volume 84 . Number 10 . October 2004 Wrisley et al . [id number]      TUG 8 seconds     Cog TUG 9.2 seconds ( > 10% difference)       Dizziness Handicap Inventory    Please rate your symptoms below: (options are always, sometimes, or no   QUESTIONS No symptoms ???0??? ; Sometimes ???2; Always ???4???   P1 Does looking up increase your problem? 2   E2 Because of your problem, do you feel frustrated? 2   F3 Because of your problem, do you restrict your travel for business or pleasure? 2   P4 Does walking down the aisle of a supermarket increase your problem? 2   F5 Because of your problem, do you have difficulty getting into or out of bed? 0   F6 Does your problem significantly restrict your participation in social activities, such as going out to dinner, going to movies, dancing or to parties? 2   F7 Because of your problem, do you have difficulty reading? 2   F8 Does performing more ambitious activities like sports, dancing, and household chores, such as sweeping or putting dishes away; increase your problem? 2   E9 Because of your problem, are you afraid to leave your home without having someone accompany you? 2   E10 Because of your problem, have you been embarrassed in front of others? 2   P11 Do quick movements of your head increase your problem? 2   F12 Because of your problem, do you avoid heights? 2   P13 Does turning over in bed increase your problem? 0   F14 Because of your problem, is it difficult for you to do strenuous housework or yard work? 2   E15 Because of your problem, are you afraid people may think that you are intoxicated? 2   F16 Because of your problem, is it difficult for you to go for a walk by yourself? 2   P17 Does walking down a sidewalk increase your problem? 2   E18 Because of your problem, is it difficult for you to concentrate? 2   F19 Because of your problem, is it difficult for you to walk around your house in the dark? 4   E20 Because of your problem, are you afraid to stay home alone? 2   E21 Because of your problem, do you feel handicapped? 2   E22 Has your problem placed stress on your relationship with members of your family or friends? 2   E23 Because of your problem, are you depressed? 2   F24 Does your problem interfere with your job or household responsibilities? 2   P25 Does bending over increase your problem? 2     Subscales:  Total:  48   Top score = 100 (maximum perceived disability); Bottom score is 0 (no perceived difficulty)    Educated on walking program of 6-10 minutes daily and continued compliance with HEP.     Today's session included discussion related to today's discharge.  Specifically, we discussed:    - that Physical Therapy is designed to address current goals for functional independence in the  home and community environment with the idea that patient will take on responsibility to maintain these gains with home or community based exercise program at discharge      - the patient can return to therapy if and when there is a significant change in medical condition or significant functional decline that affects their independence and/or safety in the home.    Patient Education: Throughout session patient educated regarding the following: Role of PT in Rehabilitation, HEP, importance of therapy, posture, body mechanics, body awareness, treatment plan and Indications/Contraindications to Exercises. Patient demonstrated and verbalized agreement and understanding.  __________________________________________________________________________________________________________-    Home Exercise Program, in physiotec under pt's name:   STS with 5 second descent   3 way hip strengthening   Standing march with 3 second hold   Stride-Tandem balance with head turns   Stride-Tandem balance with EC   Tandem Walking  Backward Walking     Outcome Measures from 10/5 reassessment:   5xSTS 13 seconds with arms across chest   30 second STS 14 reps with arms across chest     4 stage balance test   FT,ST and T 30 second each   SLS R 5.6 sec  SLS L 2.3 sec    Modified CTSIB:    - EO Firm: 30 sec   - EC Firm: 30 sec with min postural sway    - EO Foam: 30 sec with min postural sway    - EC Foam:  7 sec, eyes opened    Gait speed: 1.16 m/s      FGA 18/30  TUG 9.4 seconds   Cog TUG 11.11 seconds ( > 10% difference)   : 1,310 ft with no rest breaks   DHI 48      Communication/consultation with other professionals: Discussed POC with PT.    Treatment Rendered:    Total Treatment Time: 40 min   PPT: 40 min       I attest that I have reviewed the above information.  Signed: Maudry Diego, PT  04/04/2020 8:14 AM

## 2020-04-08 ENCOUNTER — Other Ambulatory Visit (HOSPITAL_COMMUNITY): Payer: Self-pay | Admitting: Psychiatry

## 2020-04-08 MED ORDER — PROMETHAZINE 12.5 MG TABLET
ORAL_TABLET | Freq: Four times a day (QID) | ORAL | 2 refills | 0 days | PRN
Start: 2020-04-08 — End: ?

## 2020-04-11 MED ORDER — PROMETHAZINE 12.5 MG TABLET
ORAL_TABLET | Freq: Four times a day (QID) | ORAL | 2 refills | 8 days | Status: CP | PRN
Start: 2020-04-11 — End: ?

## 2020-04-11 NOTE — Unmapped (Signed)
REFILL REQUEST    Promethazine (Phenergan) 12.5 mg tab    Last Visit Date: 11/04/2019  Next Visit Date: No future appointment scheduled    Lab Results   Component Value Date    JC Virus AB POSITIVE 08/13/2016    Hep B Surface Ag Nonreactive 03/19/2019    Hep B S Ab Nonreactive 03/12/2018    Hep B Surf Ab Quant <8.00 03/12/2018    Hep B Core Total Ab Nonreactive 03/19/2019    Hepatitis C Ab Reactive (A) 07/06/2018    HIV Antigen/Antibody Combo Nonreactive 03/12/2018    HIV Antigen/Antibody Combo Nonreactive 07/21/2014        No results found for this or any previous visit.      No results found for this or any previous visit.      No results found for this or any previous visit.

## 2020-05-11 ENCOUNTER — Ambulatory Visit
Payer: Medicaid Other | Attending: Student in an Organized Health Care Education/Training Program | Admitting: Student in an Organized Health Care Education/Training Program

## 2020-05-11 ENCOUNTER — Encounter: Payer: Self-pay | Admitting: Student in an Organized Health Care Education/Training Program

## 2020-05-11 ENCOUNTER — Other Ambulatory Visit: Payer: Self-pay

## 2020-05-11 VITALS — BP 127/87 | HR 115 | Temp 98.0°F | Ht 62.0 in | Wt 295.0 lb

## 2020-05-11 DIAGNOSIS — G35 Multiple sclerosis: Secondary | ICD-10-CM | POA: Insufficient documentation

## 2020-05-11 DIAGNOSIS — M792 Neuralgia and neuritis, unspecified: Secondary | ICD-10-CM | POA: Diagnosis not present

## 2020-05-11 DIAGNOSIS — R202 Paresthesia of skin: Secondary | ICD-10-CM | POA: Diagnosis not present

## 2020-05-11 DIAGNOSIS — G894 Chronic pain syndrome: Secondary | ICD-10-CM | POA: Insufficient documentation

## 2020-05-11 MED ORDER — CHOLECALCIFEROL (VITAMIN D3) 1,250 MCG (50,000 UNIT) CAPSULE
ORAL_CAPSULE | 5 refills | 0 days
Start: 2020-05-11 — End: ?

## 2020-05-11 MED ORDER — TRAMADOL HCL 50 MG PO TABS: 50.0000 mg | ORAL_TABLET | Freq: Every day | ORAL | 2 refills | Status: DC | PRN

## 2020-05-11 NOTE — Progress Notes (Signed)
Nursing Pain Medication Assessment:  Safety precautions to be maintained throughout the outpatient stay will include: orient to surroundings, keep bed in low position, maintain call bell within reach at all times, provide assistance with transfer out of bed and ambulation.  Medication Inspection Compliance: Pill count conducted under aseptic conditions, in front of the patient. Neither the pills nor the bottle was removed from the patient's sight at any time. Once count was completed pills were immediately returned to the patient in their original bottle.  Medication: Tramadol (Ultram) Pill/Patch Count: 5 of 30 pills remain Pill/Patch Appearance: Markings consistent with prescribed medication Bottle Appearance: Standard pharmacy container. Clearly labeled. Filled Date: 61 / 20 / 21 Last Medication intake:  Day before yesterdaySafety precautions to be maintained throughout the outpatient stay will include: orient to surroundings, keep bed in low position, maintain call bell within reach at all times, provide assistance with transfer out of bed and ambulation.

## 2020-05-11 NOTE — Progress Notes (Signed)
PROVIDER NOTE: Information contained herein reflects review and annotations entered in association with encounter. Interpretation of such information and data should be left to medically-trained personnel. Information provided to patient can be located elsewhere in the medical record under "Patient Instructions". Document created using STT-dictation technology, any transcriptional errors that may result from process are unintentional.    Patient: Natasha [name]  Service Category: E/M  Provider: Gillis Santa, MD  DOB: 2000-08-27  DOS: 05/11/2020  Specialty: Interventional Pain Management  MRN: [id number]  Setting: Ambulatory outpatient  PCP: Wayland Denis, PA-C  Type: Established Patient    Referring Provider: Wayland Denis, PA-C  Location: Office  Delivery: Face-to-face     HPI  Ms. Ryelynn Guedea, a 20 y.o. year old female, is here today because of her Multiple sclerosis ([place]) [G35]. Ms. Sanagustin primary complain today is Back Pain (Lower back) Last encounter: My last encounter with her was on 02/15/2020. Pertinent problems: Ms. Avitia has Multiple sclerosis ([place]); Chronic pain syndrome; Neuropathic pain; Paresthesias; and Anxiety disorder on their pertinent problem list. Pain Assessment: Severity of Chronic pain is reported as a 5 /10. Location: Back Lower/Pain radiaties down both leg to her ankle. Onset: More than a month ago. Quality: Shooting,Throbbing. Timing: Intermittent. Modifying factor(s): meds. Vitals:  height is $RemoveB'5\' 2"'xoXBVYRR$  (1.575 m) and weight is 295 lb (133.8 kg). Her temperature is 98 F (36.7 C). Her blood pressure is 127/87 and her pulse is 115 (abnormal). Her oxygen saturation is 100%.   Reason for encounter: medication management.    No change in medical history since last visit.  Patient's pain is at baseline.  Dr. Lyman Speller, psychologist attempted to connect with her on 03/06/2020 but apparently [facility] forgot about her appointment And was unable to do her visit at that  time.  She states that the tramadol does help manage her pain and that she does not take it every day.  I counseled her on the risks of chronic opioid therapy specifically at her age.  I stressed the importance of weight loss and how this will have a large impact on her low back pain.  Unfortunately, she has failed various Opioid analgesic trials including NSAIDs, muscle relaxers, TCAs, antidepressants which have been documented extensively in her note.  She has also failed various MS medications. She has completed physical therapy and states that she will perform those exercises at home. Continues with speech therapy.  04/17/2020  02/15/2020   1  Tramadol Hcl 50 Mg Tablet  30.00  30  [facility]  [id number]  [place] ([id number])  2/2  5.00 MME  Private Pay  [place] [facility]     Pharmacotherapy Assessment   Analgesic: Tramadol 30 mg daily as needed, quantity 30/month    Monitoring: [place] PMP: PDMP reviewed during this encounter.       Pharmacotherapy: No side-effects or adverse reactions reported. Compliance: No problems identified. Effectiveness: Clinically acceptable.  Chauncey Fischer, RN  05/11/2020  8:16 AM  Sign when Signing Visit Nursing Pain Medication Assessment:  Safety precautions to be maintained throughout the outpatient stay will include: orient to surroundings, keep bed in low position, maintain call bell within reach at all times, provide assistance with transfer out of bed and ambulation.  Medication Inspection Compliance: Pill count conducted under aseptic conditions, in front of the patient. Neither the pills nor the bottle was removed from the patient's sight at any time. Once count was completed pills were immediately returned to the patient in their original bottle.  Medication: Tramadol (Ultram) Pill/Patch  Count: 5 of 30 pills remain Pill/Patch Appearance: Markings consistent with prescribed medication Bottle Appearance: Standard pharmacy container. Clearly labeled. Filled Date: 32 / 20 / 21 Last  Medication intake:  Day before yesterdaySafety precautions to be maintained throughout the outpatient stay will include: orient to surroundings, keep bed in low position, maintain call bell within reach at all times, provide assistance with transfer out of bed and ambulation.     UDS:  Summary  Date Value Ref Range Status  08/10/2019 Note  Final    Comment:    ==================================================================== Compliance Drug Analysis, Ur ==================================================================== Test                             Result       Flag       Units Drug Present and Declared for Prescription Verification   Amitriptyline                  PRESENT      EXPECTED   Nortriptyline                  PRESENT      EXPECTED    Nortriptyline is an expected metabolite of amitriptyline.   Naproxen                       PRESENT      EXPECTED Drug Present not Declared for Prescription Verification   Ibuprofen                      PRESENT      UNEXPECTED   Diphenhydramine                PRESENT      UNEXPECTED Drug Absent but Declared for Prescription Verification   Tramadol                       Not Detected UNEXPECTED ng/mg creat   Carbamazepine                  Not Detected UNEXPECTED   Gabapentin                     Not Detected UNEXPECTED   Cyclobenzaprine                Not Detected UNEXPECTED   Acetaminophen                  Not Detected UNEXPECTED    Acetaminophen, as indicated in the declared medication list, is not    always detected even when used as directed.   Promethazine                   Not Detected UNEXPECTED ==================================================================== Test                      Result    Flag   Units      Ref Range   Creatinine              44               mg/dL      >=20 ==================================================================== Declared Medications:  The flagging and interpretation on this report are based on  the  following declared medications.  Unexpected results may arise from  inaccuracies in the declared medications.  **Note: The testing  scope of this panel includes these medications:  Amitriptyline (Elavil)  Carbamazepine (Carbatrol)  Cyclobenzaprine (Flexeril)  Gabapentin (Neurontin)  Naproxen (Naprosyn)  Promethazine (Phenergan)  Tramadol (Ultram)  **Note: The testing scope of this panel does not include small to  moderate amounts of these reported medications:  Acetaminophen (Tylenol)  **Note: The testing scope of this panel does not include the  following reported medications:  Bacitracin  Cholecalciferol  Ethinyl Estradiol (Xulane)  Fingolimod  Metformin (Glucophage)  Norelgestromin (Xulane)  Ofatumumab (Kesimpta) ==================================================================== For clinical consultation, please call [phone]. ====================================================================      ROS  Constitutional: Denies any fever or chills Gastrointestinal: No reported hemesis, hematochezia, vomiting, or acute GI distress Musculoskeletal: +lbp Neurological: No reported episodes of acute onset apraxia, aphasia, dysarthria, agnosia, amnesia, paralysis, loss of coordination, or loss of consciousness  Medication Review  Cholecalciferol, Ofatumumab, acetaminophen, amitriptyline, cyclobenzaprine, naproxen, promethazine, traMADol, and traZODone  History Review  Allergy: Ms. Asay has No Known Allergies. Drug: Ms. Caraway  reports no history of drug use. Alcohol:  reports no history of alcohol use. Tobacco:  reports that she has never smoked. She has never used smokeless tobacco. Social: Ms. Muller  reports that she has never smoked. She has never used smokeless tobacco. She reports that she does not drink alcohol and does not use drugs. Medical:  has a past medical history of Multiple sclerosis (HCC) (06/2014). Surgical: Ms. Tapley  has no  past surgical history on file. Family: family history includes ADD / ADHD in her brother and mother; Alcohol abuse in her maternal grandfather and maternal grandmother; Depression in her mother.  Laboratory Chemistry Profile   Renal Lab Results  Component Value Date   BUN 14 04/29/2019   CREATININE 0.50 04/29/2019   GFRAA >60 04/29/2019   GFRNONAA >60 04/29/2019     Hepatic No results found for: AST, ALT, ALBUMIN, ALKPHOS, HCVAB, AMYLASE, LIPASE, AMMONIA   Electrolytes Lab Results  Component Value Date   NA 137 04/29/2019   K 3.8 04/29/2019   CL 104 04/29/2019   CALCIUM 8.8 (L) 04/29/2019     Bone No results found for: VD25OH, [id number], MV8418EF2, TA6728AM5, 25OHVITD1, 25OHVITD2, 25OHVITD3, TESTOFREE, TESTOSTERONE   Inflammation (CRP: Acute Phase) (ESR: Chronic Phase) No results found for: CRP, ESRSEDRATE, LATICACIDVEN     Note: Above Lab results reviewed.  Recent Imaging Review  Note: Reviewed        Physical Exam  General appearance: alert, cooperative and morbidly obese Mental status: Alert, oriented x 3 (person, place, & time)       Respiratory: No evidence of acute respiratory distress Eyes: PERLA Vitals: BP 127/87   Pulse (!) 115   Temp 98 F (36.7 C)   Ht 5\' 2"  (1.575 m)   Wt 295 lb (133.8 kg)   SpO2 100%   BMI 53.96 kg/m  BMI: Estimated body mass index is 53.96 kg/m as calculated from the following:   Height as of this encounter: 5\' 2"  (1.575 m).   Weight as of this encounter: 295 lb (133.8 kg). Ideal: Ideal body weight: 50.1 kg (110 lb 7.2 oz) Adjusted ideal body weight: 83.6 kg (184 lb 4.3 oz)  Mid and low back pain.  Pain with lateral rotation.  Normal gait. 5 out of 5 strength bilateral lower extremity: Plantar flexion, dorsiflexion, knee flexion, knee extension.   Assessment   Status Diagnosis  Controlled Controlled Controlled 1. Multiple sclerosis (HCC)   2. Paresthesias   3. Neuropathic pain   4. Chronic pain syndrome  Plan of Care   Ms. Haya Hemler has a current medication list which includes the following long-term medication(s): amitriptyline, promethazine, [START ON 05/17/2020] tramadol, and trazodone.   Patient counseled to take lowest effective dose possible.  For size importance of lifestyle optimization such as sleep hygiene, healthy eating, and exercise.  Continue acetaminophen, amitriptyline, Flexeril, naproxen as prescribed.  Prescription for tramadol as below for severe breakthrough pain.  No dust escalation beyond this.  Can consider T12-L1 ESI future if patient has worsening pain.  Pharmacotherapy (Medications Ordered): Meds ordered this encounter  Medications  . traMADol (ULTRAM) 50 MG tablet    Sig: Take 1 tablet (50 mg total) by mouth daily as needed.    Dispense:  30 tablet    Refill:  2   Follow-up plan:   Return in about 3 months (around 08/09/2020) for Medication Management, in person.   Recent Visits Date Type Provider Dept  02/15/20 Office Visit Gillis Santa, MD [facility]  Showing recent visits within past 90 days and meeting all other requirements Today's Visits Date Type Provider Dept  05/11/20 Office Visit Gillis Santa, MD [facility]  Showing today's visits and meeting all other requirements Future Appointments Date Type Provider Dept  08/08/20 Appointment Gillis Santa, MD [facility]  Showing future appointments within next 90 days and meeting all other requirements  I discussed the assessment and treatment plan with the patient. The patient was provided an opportunity to ask questions and all were answered. The patient agreed with the plan and demonstrated an understanding of the instructions.  Patient advised to call back or seek an in-person evaluation if the symptoms or condition worsens.  Duration of encounter: 30 minutes.  Note by: Gillis Santa, MD Date: 05/11/2020; Time: 8:39 AM

## 2020-05-12 MED ORDER — CHOLECALCIFEROL (VITAMIN D3) 1,250 MCG (50,000 UNIT) CAPSULE
ORAL_CAPSULE | 5 refills | 0 days
Start: 2020-05-12 — End: ?

## 2020-05-12 NOTE — Unmapped (Signed)
REFILL REQUEST    Last Visit Date: 11/04/2019  No future visit set up      [facility]  Lab Results   Component Value Date    JC Virus AB POSITIVE 08/13/2016    Hep B Surface Ag Nonreactive 03/19/2019    Hep B S Ab Nonreactive 03/12/2018    Hep B Surf Ab Quant <8.00 03/12/2018    Hep B Core Total Ab Nonreactive 03/19/2019    Hepatitis C Ab Reactive (A) 07/06/2018    HIV Antigen/Antibody Combo Nonreactive 03/12/2018    HIV Antigen/Antibody Combo Nonreactive 07/21/2014        No results found for this or any previous visit.      No results found for this or any previous visit.      No results found for this or any previous visit.

## 2020-05-21 MED ORDER — CHOLECALCIFEROL (VITAMIN D3) 1,250 MCG (50,000 UNIT) CAPSULE
ORAL_CAPSULE | 5 refills | 0 days
Start: 2020-05-21 — End: ?

## 2020-05-22 DIAGNOSIS — G35 Multiple sclerosis: Principal | ICD-10-CM

## 2020-05-22 MED ORDER — BUPROPION HCL XL 300 MG 24 HR TABLET, EXTENDED RELEASE
ORAL_TABLET | ORAL | 5 refills | 0.00000 days | Status: CP
Start: 2020-05-22 — End: ?

## 2020-05-22 NOTE — Unmapped (Signed)
REFILL REQUEST    Bupropion(wellbutrin XL) 300 mg 24 hr    Last Visit Date: 11/04/2019  No future appointment scheduled  [name]  Lab Results   Component Value Date    JC Virus AB POSITIVE 08/13/2016    Hep B Surface Ag Nonreactive 03/19/2019    Hep B S Ab Nonreactive 03/12/2018    Hep B Surf Ab Quant <8.00 03/12/2018    Hep B Core Total Ab Nonreactive 03/19/2019    Hepatitis C Ab Reactive (A) 07/06/2018    HIV Antigen/Antibody Combo Nonreactive 03/12/2018    HIV Antigen/Antibody Combo Nonreactive 07/21/2014        No results found for this or any previous visit.      No results found for this or any previous visit.      No results found for this or any previous visit.

## 2020-05-22 NOTE — Unmapped (Signed)
REFILL REQUEST    Promethazine(phenergan)12.5 last written 04/11/2020 QTY 30 tab with 2 refill    Cholecalciferol,vitaminD3-1250 mcg, 50000 unit  Last written 11/04/2019 QTY 24 no refills  [name]  Last Visit Date: 11/04/2019  No future appointment set up.    Lab Results   Component Value Date    JC Virus AB POSITIVE 08/13/2016    Hep B Surface Ag Nonreactive 03/19/2019    Hep B S Ab Nonreactive 03/12/2018    Hep B Surf Ab Quant <8.00 03/12/2018    Hep B Core Total Ab Nonreactive 03/19/2019    Hepatitis C Ab Reactive (A) 07/06/2018    HIV Antigen/Antibody Combo Nonreactive 03/12/2018    HIV Antigen/Antibody Combo Nonreactive 07/21/2014        No results found for this or any previous visit.      No results found for this or any previous visit.      No results found for this or any previous visit.

## 2020-05-23 DIAGNOSIS — G35 Multiple sclerosis: Principal | ICD-10-CM

## 2020-05-23 NOTE — Unmapped (Signed)
[facility] Clinical Assessment & Refill Coordination Note    Donna Mata, DOB: 12-11-2000  Phone: [phone] (home)     All above HIPAA information was verified with patient.     Was a Nurse, learning disability used for this call? No    Specialty Medication(s):   Neurology: Kesimpta     Current Outpatient Medications   Medication Sig Dispense Refill   ??? acetaminophen (TYLENOL ORAL) Take by mouth.     ??? amantadine HCL (SYMMETREL) 100 mg capsule ONE TWO CAPSULS FIRST THING IN THE MORNING. IF NEEDED MAY TAKE A ONE TO TWO CAPSULES BETWEEN 12-2PM. 60 capsule 5   ??? amitriptyline (ELAVIL) 25 MG tablet TAKE 3 TABLETS BY MOUTH EVERY DAY AT BEDTIME 90 tablet 5   ??? buPROPion (WELLBUTRIN XL) 300 MG 24 hr tablet Take 1 tablet (300 mg total) by mouth every morning. 30 tablet 2   ??? cholecalciferol, vitamin D3-1,250 mcg, 50,000 unit,, 1,250 mcg (50,000 unit) capsule Take 1 capsule (1,250 mcg total) by mouth once a week. 24 capsule 0   ??? cyclobenzaprine (FLEXERIL) 5 MG tablet Take by mouth.     ??? diphenhydramine HCl (BENADRYL ORAL) Take by mouth.     ??? naproxen (NAPROSYN) 500 MG tablet TAKE 1 TABLET BY MOUTH TWICE A DAY WITH MEALS     ??? ofatumumab (KESIMPTA PEN) 20 mg/0.4 mL PnIj Inject contents of 1 syringe (20 mg) under the skin every 28 days. 1.6 mL 5   ??? pantoprazole (PROTONIX) 20 MG tablet Take 1 tablet (20 mg total) by mouth two (2) times a day. 180 tablet 1   ??? promethazine (PHENERGAN) 12.5 MG tablet TAKE 1 TABLET (12.5 MG TOTAL) BY MOUTH EVERY SIX (6) HOURS AS NEEDED FOR NAUSEA. 30 tablet 2   ??? traZODone (DESYREL) 100 MG tablet Take 100 mg by mouth.       No current facility-administered medications for this visit.        Changes to medications: [name] reports no changes at this time.    Allergies   Allergen Reactions   ??? Ocrelizumab Anaphylaxis       Changes to allergies: No    SPECIALTY MEDICATION ADHERENCE     Kesimpa 20 mg/0.32ml: 0 days of medicine on hand       Medication Adherence Patient reported X missed doses in the last month: 0  Specialty Medication: Kesimpta 20 mg/0.4 ml  Patient is on additional specialty medications: No  Informant: patient   Other adherence tool: It is part of patient's routine.   Confirmed plan for next specialty medication refill: delivery by pharmacy  Refills needed for supportive medications: not needed          Specialty medication(s) dose(s) confirmed: Regimen is correct and unchanged.     Are there any concerns with adherence? No    Adherence counseling provided? Not needed    CLINICAL MANAGEMENT AND INTERVENTION      Clinical Benefit Assessment:    Do you feel the medicine is effective or helping your condition? Yes    Clinical Benefit counseling provided? Not needed    Adverse Effects Assessment:    Are you experiencing any side effects? No    Are you experiencing difficulty administering your medicine? No    Quality of Life Assessment:    How many days over the past month did your MS  keep you from your normal activities? For example, brushing your teeth or getting up in the morning. 0  Have you discussed this with your provider? Not needed    Therapy Appropriateness:    Is therapy appropriate? Yes, therapy is appropriate and should be continued    DISEASE/MEDICATION-SPECIFIC INFORMATION      For patients on injectable medications: Patient currently has 0 doses left.  Next injection is scheduled for 2 weeks.    PATIENT SPECIFIC NEEDS     - Does the patient have any physical, cognitive, or cultural barriers? No    - Is the patient high risk? No    - Does the patient require a Care Management Plan? No     - Does the patient require physician intervention or other additional services (i.e. nutrition, smoking cessation, social work)? No      SHIPPING     Specialty Medication(s) to be Shipped:   Neurology: Kesimpta    Other medication(s) to be shipped: No additional medications requested for fill at this time     Changes to insurance: No    Delivery Scheduled: Yes, Expected medication delivery date: 05/30/20.     Medication will be delivered via Same Day Courier to the confirmed prescription address in [facility].    The patient will receive a drug information handout for each medication shipped and additional FDA Medication Guides as required.  Verified that patient has previously received a Conservation officer, historic buildings.    All of the patient's questions and concerns have been addressed.    Arnold Long   [facility] Specialty Pharmacist

## 2020-05-25 MED ORDER — PANTOPRAZOLE 20 MG TABLET,DELAYED RELEASE
ORAL_TABLET | Freq: Two times a day (BID) | ORAL | 1 refills | 90 days | Status: CP
Start: 2020-05-25 — End: ?

## 2020-05-25 MED ORDER — PROMETHAZINE 12.5 MG TABLET
ORAL_TABLET | Freq: Four times a day (QID) | ORAL | 2 refills | 8 days | PRN
Start: 2020-05-25 — End: ?

## 2020-05-30 MED FILL — KESIMPTA PEN 20 MG/0.4 ML SUBCUTANEOUS PEN INJECTOR: 84 days supply | Qty: 1.2 | Fill #2

## 2020-06-16 NOTE — Unmapped (Signed)
Request received via interface.     Provider: [name]    Last Visit Date: 11/04/2019    Next Visit Date: Visit date not found    Lab Results   Component Value Date    JC Virus AB POSITIVE 08/13/2016    Hep B Surface Ag Nonreactive 03/19/2019    Hep B S Ab Nonreactive 03/12/2018    Hep B Surf Ab Quant <8.00 03/12/2018    Hep B Core Total Ab Nonreactive 03/19/2019    Hepatitis C Ab Reactive (A) 07/06/2018    HIV Antigen/Antibody Combo Nonreactive 03/12/2018    HIV Antigen/Antibody Combo Nonreactive 07/21/2014        No results found for this or any previous visit.      No results found for this or any previous visit.      No results found for this or any previous visit.

## 2020-06-19 MED ORDER — CHOLECALCIFEROL (VITAMIN D3) 1,250 MCG (50,000 UNIT) CAPSULE
ORAL_CAPSULE | 5 refills | 0 days
Start: 2020-06-19 — End: ?

## 2020-06-21 MED ORDER — PROMETHAZINE 12.5 MG TABLET
ORAL_TABLET | Freq: Four times a day (QID) | ORAL | 2 refills | 0.00000 days | PRN
Start: 2020-06-21 — End: ?

## 2020-06-22 NOTE — Unmapped (Signed)
REFILL REQUEST    Last Visit Date:11/04/2019  Next Visit Date:No future appointment scheduled  Donna Mata  Lab Results   Component Value Date    JC Virus AB POSITIVE 08/13/2016    Hep B Surface Ag Nonreactive 03/19/2019    Hep B S Ab Nonreactive 03/12/2018    Hep B Surf Ab Quant <8.00 03/12/2018    Hep B Core Total Ab Nonreactive 03/19/2019    Hepatitis C Ab Reactive (A) 07/06/2018    HIV Antigen/Antibody Combo Nonreactive 03/12/2018    HIV Antigen/Antibody Combo Nonreactive 07/21/2014        No results found for this or any previous visit.      No results found for this or any previous visit.      No results found for this or any previous visit.

## 2020-06-25 MED ORDER — CHOLECALCIFEROL (VITAMIN D3) 1,250 MCG (50,000 UNIT) CAPSULE
ORAL_CAPSULE | 5 refills | 0 days
Start: 2020-06-25 — End: ?

## 2020-06-26 NOTE — Unmapped (Signed)
REFILL REQUEST    Last Visit Date:11/04/2019  Next Visit Date: No future appointment scheduled  Last seen [name]  Lab Results   Component Value Date    JC Virus AB POSITIVE 08/13/2016    Hep B Surface Ag Nonreactive 03/19/2019    Hep B S Ab Nonreactive 03/12/2018    Hep B Surf Ab Quant <8.00 03/12/2018    Hep B Core Total Ab Nonreactive 03/19/2019    Hepatitis C Ab Reactive (A) 07/06/2018    HIV Antigen/Antibody Combo Nonreactive 03/12/2018    HIV Antigen/Antibody Combo Nonreactive 07/21/2014        No results found for this or any previous visit.      No results found for this or any previous visit.      No results found for this or any previous visit.

## 2020-06-27 MED ORDER — CHOLECALCIFEROL (VITAMIN D3) 1,250 MCG (50,000 UNIT) CAPSULE
ORAL_CAPSULE | 5 refills | 0 days
Start: 2020-06-27 — End: ?

## 2020-07-17 MED ORDER — CHOLECALCIFEROL (VITAMIN D3) 1,250 MCG (50,000 UNIT) CAPSULE
ORAL_CAPSULE | 5 refills | 0 days
Start: 2020-07-17 — End: ?

## 2020-07-18 NOTE — Unmapped (Signed)
REFILL REQUEST      Last Visit Date:11/04/2019  Next Visit Date: 08/08/2020  Last seen [facility]  Lab Results   Component Value Date    JC Virus AB POSITIVE 08/13/2016    Hep B Surface Ag Nonreactive 03/19/2019    Hep B S Ab Nonreactive 03/12/2018    Hep B Surf Ab Quant <8.00 03/12/2018    Hep B Core Total Ab Nonreactive 03/19/2019    Hepatitis C Ab Reactive (A) 07/06/2018    HIV Antigen/Antibody Combo Nonreactive 03/12/2018    HIV Antigen/Antibody Combo Nonreactive 07/21/2014        No results found for this or any previous visit.      No results found for this or any previous visit.      No results found for this or any previous visit.

## 2020-08-03 MED ORDER — CHOLECALCIFEROL (VITAMIN D3) 1,250 MCG (50,000 UNIT) CAPSULE
ORAL_CAPSULE | 5 refills | 0 days
Start: 2020-08-03 — End: ?

## 2020-08-03 NOTE — Unmapped (Signed)
Last Visit Date: 11/04/2019  Next Visit Date: 08/08/2020    Lab Results   Component Value Date    JC Virus AB POSITIVE 08/13/2016    Hep B Surface Ag Nonreactive 03/19/2019    Hep B S Ab Nonreactive 03/12/2018    Hep B Surf Ab Quant <8.00 03/12/2018    Hep B Core Total Ab Nonreactive 03/19/2019    Hepatitis C Ab Reactive (A) 07/06/2018    HIV Antigen/Antibody Combo Nonreactive 03/12/2018    HIV Antigen/Antibody Combo Nonreactive 07/21/2014        No results found for this or any previous visit.      No results found for this or any previous visit.      No results found for this or any previous visit.

## 2020-08-07 NOTE — Unmapped (Addendum)
[facility] at [facility]  Multiple Sclerosis / Neuroimmunology Division  Keldan Eplin [name] [facility]  Physician Assistant    Phone: [phone]  Fax: [phone]      Patient Name: Donna Mata [facility]   Date of Birth: 02-Jan-2001  Medical Record Number: [id number]  [address]  [place] [address]     Direct entry by:  Cy Blamer, PA-C.  Supervising Physician: Dr. Desma Mcgregor, Dr. Quillian Quince, [facility] Pamala Duffel.    DATE OF VISIT: August 07, 2020    REASON FOR VISIT: Followup in the Neuroimmunology Clinic for evaluation of Multiple Sclerosis / Demyelinating Disease / or other Neurological problem.    **I personally spent 41 minutes face-to-face and non-face-to-face in the care of this patient, which includes all pre, intra, and post visit time on the date of service.     **Seen by Dr. Johnnye Lana 04/24/2016.    ASSESSMENT AND PLAN:  ** Relapsing Remitting Multiple Sclerosis.  -Started Kesimpta 04/12/2019.    -Reviewed  MRI reports with patient of Thoracic spine,  with / without contrast. dated 02/09/2020 with no comparison which showed no spinal cord lesions.  -Schedule yearly MRI of the  Brain, Cervical  and Thoracic spine with / without contrast.  Schedule for  12/2020.  -Check CBC/diff, LFT and Vitamin D 25-OH at [facility] outpatient lab today.  -Patient phoned and discussed Evusheld and COVID boosters. Handout sent in a my chart message.    **Continuation of pain, numbness and tingling of lower extremities and night time muscle spasms: Increase  Amitriptyline to 50 mg if needed at bedtime or can take flexeril 5mg .  **Neurogenic bladder, (Leakage and urgency): Continue to monitor.  **Fatigue: Start Provigil 100mg  BID. Amantaidne had no effect.  -Follow up 6 months.    INTERVAL HISTORY / CHIEF COMPLAINT :  Patient reports no MS relapses since the last visit 01/20/2020.  Patient reports that they are taking DMT appropriately and are not experiencing any side effects.    Discontinued amantadine due to lack of effect.  Reports muscle spasms in her right calf that occurs at night about three times a week and lasts for several hours to all night.  Lower extremity pain, numbness and tingling that is off and on daily. Currently taking Amitriptyline at bedtime.    Moderna COVID vaccine #1 on 03/05/2021 and #2 on 04/02/2021.    VITAL SIGNS:  BP 130/82 (BP Site: L Arm, BP Position: Sitting, BP Cuff Size: Thigh)  - Pulse 104  - Ht 157.5 cm (5' 2)  - Wt (!) 137.7 kg (303 lb 8 oz)  - LMP 06/10/2020  - BMI 55.51 kg/m??     PHYSICAL EXAMINATION:  GENERAL:  Alert and oriented to person, place, time and situation.    Recent and remote memory intact.      Neurological Examination:   Cranial Nerves:   II, III- Pupils are equal 3 mm and reactive to light b/l.  Visual Acuity:  OD 20/20, -2, OS 20/20.  III, IV, VI- extra ocul//ar movements are intact, No ptosis, no nystagmus.  V- sensation of the face intact b/l.  VII- face symmetrical, no facial droop, normal facial movements with smile/grimace  VIII- Hearing grossly intact.  IX and X- symmetric palate contraction, normal gag bilaterally  XI- Full shoulder shrug bilaterally  XII- Tongue protrudes midline, full range of movements of the tongue    Motor Exam:      Muscles UEs  LEs     R L   R L   Deltoids 5/5 5/5 Hip flexors  5/5 5/5   Biceps 5/5 5/5 Hip extensors 5/5 5/5   Triceps 5/5 5/5 Knee flexors 5/5 5/5   Hand grip 5/5 5/5 Knee extensors 5/5 5/5      Foot dorsal flexors 5/5 5/5      Foot plantar flexors 5/5 5/5      Normal bulk and tone.  No clonus.    Reflexes R L   Brachioradialis +2 +2   Biceps +2 +2   Triceps +2 +2   Patella +2 +2   Achilles +2 +2     Negative babinski.    Sensory UEs LEs    R L R L   Light touch WNL WNL WNL WNL   Pin prick WNL WNL WNL WNL   Vibration WNL WNL WNL WNL   Proprioception   WNL WNL        Cerebellar/Coordination:  Rapid alternating movements, finger-to-nose and heel-to-shin bilaterally demonstrates no abnormalities.    Romberg was negative with eyes closed.    Gait: Normal stride, base and  armswing. Able to heel, and toe gait without difficulty. Unable to tandem gait.     PRIOR HISTORY: A 20 y.o.caucasian female.  Patient presented to [facility] for parasthesias and weakness with notable multiple enhancing and non enhancing lesions throughout brain and spinal cord consistent with MS. Lesions not typical for ADEM and she had no prior viral illness or immunization before episode. Her lumbar puncture was also consistent with demyelinating disease with no evidence of infection and elevated IGG index with 4+ oligoclonal bands. Diagnosed with RRMS by Dr. Alberteen Spindle 09/2014 and started on Gilenya.    Hep C AB positive, Hep C RNA, quantitative, ??PCR undetected. LFT's wnl. ??   Phone consult with Hepatology Dr Woodfin Ganja. repeated Hep C AB positve. Most likely false positive.  There is a 5% chance of vertical transmission.  If this were to be the case most children clear the virus spontaneously by age three. Recommendation would still be the same as above.    MRI REPORTS:  Brain and cervical spine from 11/23/2019 show no changes. 02/09/2020 thoracic spine MRI showed no spinal cord lesions.    LUMBAR PUNCTURE:  07/21/2014 Four or more Oligoclonal bands and IgG index = 0.8.    SCREENING LABS:   07/21/2014 : ACE 20, ANA neg, RF <6.3, , B12 420 , NMO APQ4 IgG neg and Vit D 25-OH 20.  04/17/2017  RPR,  Lyme serology and   MOG IgG1 are all negative.  03/12/2018 JCV positive, index = 3.51.    07/17/2017 Baseline:  T25FW= 4.98. seconds.  MMSE = 26.  PHQ9 = 8.    MS FLARE-UP HISTORY:  Received IVMP while in hospital at time of diagnoses.  04/24/2016 Left arm and left leg weakness, left-sided facial and body sensory disturbance with perception of pain, sleep disturbances in the setting of URI. Received 5 days of IVMP with 100% resolution.  04/26/2018  Left side weakness and  numbness and tingling from head to toe Seen at [facility] ED and received IV Steroids x one. Sent home with oral Prednisone for 4 days. !00% resolved.  Clinical flare-up from 05/01/2018 100% resolved. Symptoms were Numbness or tingling. Received one day of IV steroids followed by four days of oral.  11/16/2018 Received 3 days of oral steroids 1250mg  for eye symptoms. Eye symptoms were unchanged with the steroids.  MS MEDICATION:  Gilenya started 12/13/2014 -08/2016.  Flare up 03/2016 that required 5 days of IVMP with 100% resolution. New ring enahnacing lesion seen on brain MRI and new cervical spine non enhancing lesion.  JCV = positive with index= 3.51.  Tecfidera 08/2016 - 03/2018.  induction of Ocrevus 300mg  on  04/23/2018 (partial) and 300mg  on  05/07/2018 (full) and  600mg  11/09/2018 ( partial). Thickness/knot/ irratation in throat, stomach burning. Discontinued 11/09/2018.  Oral prednisone monthly starting 11/2018.    GYN HISTORY HISTORY:  Menses started at age 56.    FAMILY HISTORY:  No MS. Mother under going work up for Lupus.  Great grandfather and grandmother with IDDM.    REVIEW OF SYSTEMS:  A 10-systems review was performed and, unless otherwise noted, declared negative by patient.    No visits with results within 1 Month(s) from this visit.   Latest known visit with results is:   Appointment on 03/19/2019   Component Date Value Ref Range Status   ??? Total IgG 03/19/2019 762  600-1,700 mg/dL Final   ??? Rituxin DG64% 03/19/2019 100   Final   ??? CD3% (T Cells) 03/19/2019 83  61 - 86 % Final   ??? Absolute CD3 Count 03/19/2019 1,651  915-3,400 /uL Final   ??? CD19% (B Cells) 03/19/2019 1 (A) 7 - 23 % Final   ??? Absolute CD19 Count 03/19/2019 20 (A) 105 - 920 /uL Final   ??? CD16/56% NK Cell 03/19/2019 15  1 - 27 % Final   ??? Absolute CD16/56 Count 03/19/2019 298  15-1,080 /uL Final   ??? IgM 03/19/2019 92  35 - 290 mg/dL Final       No visits with results within 1 Month(s) from this visit.   Latest known visit with results is:   Appointment on 03/19/2019   Component Date Value Ref Range Status   ??? Total IgG 03/19/2019 762  600-1,700 mg/dL Final   ??? Rituxin QI34% 03/19/2019 100   Final   ??? CD3% (T Cells) 03/19/2019 83  61 - 86 % Final   ??? Absolute CD3 Count 03/19/2019 1,651  915-3,400 /uL Final   ??? CD19% (B Cells) 03/19/2019 1 (A) 7 - 23 % Final   ??? Absolute CD19 Count 03/19/2019 20 (A) 105 - 920 /uL Final   ??? CD16/56% NK Cell 03/19/2019 15  1 - 27 % Final   ??? Absolute CD16/56 Count 03/19/2019 298  15-1,080 /uL Final   ??? IgM 03/19/2019 92  35 - 290 mg/dL Final       PROBLEM LIST:    Patient Active Problem List   Diagnosis   ??? Fatigue   ??? Rash   ??? Obesity   ??? Multiple sclerosis (CMS-HCC)   ??? Multiple sclerosis exacerbation (CMS-HCC)   ??? Neuropathic pain   ??? Chronic bilateral low back pain without sciatica   ??? Eye pain, bilateral   ??? Type 2 diabetes mellitus without complication (CMS-HCC)   ??? Immunosuppression due to drug therapy (CMS-HCC)   ??? BMI 50.0-59.9, adult (CMS-HCC)     Current Outpatient Medications   Medication Sig Dispense Refill   ??? acetaminophen (TYLENOL ORAL) Take by mouth.     ??? amantadine HCL (SYMMETREL) 100 mg capsule ONE TWO CAPSULS FIRST THING IN THE MORNING. IF NEEDED MAY TAKE A ONE TO TWO CAPSULES BETWEEN 12-2PM. 60 capsule 5   ??? amitriptyline (ELAVIL) 25 MG tablet TAKE 3 TABLETS BY MOUTH EVERY DAY AT BEDTIME 90 tablet 5   ???  buPROPion (WELLBUTRIN XL) 300 MG 24 hr tablet TAKE 1 TABLET BY MOUTH EVERY DAY IN THE MORNING 30 tablet 5   ??? cholecalciferol, vitamin D3-1,250 mcg, 50,000 unit,, 1,250 mcg (50,000 unit) capsule Take 1 capsule (1,250 mcg total) by mouth once a week. 24 capsule 0   ??? cyclobenzaprine (FLEXERIL) 5 MG tablet Take by mouth.     ??? diphenhydramine HCl (BENADRYL ORAL) Take by mouth.     ??? naproxen (NAPROSYN) 500 MG tablet TAKE 1 TABLET BY MOUTH TWICE A DAY WITH MEALS     ??? ofatumumab (KESIMPTA PEN) 20 mg/0.4 mL PnIj Inject contents of 1 syringe (20 mg) under the skin every 28 days. 1.6 mL 5   ??? pantoprazole (PROTONIX) 20 MG tablet Take 1 tablet (20 mg total) by mouth two (2) times a day. 180 tablet 1   ??? promethazine (PHENERGAN) 12.5 MG tablet TAKE 1 TABLET (12.5 MG TOTAL) BY MOUTH EVERY SIX (6) HOURS AS NEEDED FOR NAUSEA. 30 tablet 2   ??? traZODone (DESYREL) 100 MG tablet Take 100 mg by mouth.       No current facility-administered medications for this visit.         Past Surgical Hx:    Past Surgical History:   Procedure Laterality Date   ??? NO PAST SURGERIES         Social Hx:    Social History     Socioeconomic History   ??? Marital status: Single     Spouse name: Not on file   ??? Number of children: Not on file   ??? Years of education: Not on file   ??? Highest education level: Not on file   Occupational History   ??? Not on file   Tobacco Use   ??? Smoking status: Never Smoker   ??? Smokeless tobacco: Never Used   Substance and Sexual Activity   ??? Alcohol use: No     Alcohol/week: 0.0 standard drinks   ??? Drug use: No   ??? Sexual activity: Never   Other Topics Concern   ??? Not on file   Social History Narrative    Patient lives at home with Mom, stepfather, and 23 yo brother. She is in 8th grade and is currently not doing well in school. She denies bullying or other psychosocial stressors. She fells safe at home. She denies tobacco, alcohol, and drug use. She has never been sexually active. She is on OCPs to regular her periods. LMP was 2/28.      Social Determinants of Health     Financial Resource Strain: Not on file   Food Insecurity: Not on file   Transportation Needs: Not on file   Physical Activity: Not on file   Stress: Not on file   Social Connections: Not on file       Family Hx:    Family History   Problem Relation Age of Onset   ??? Diabetes type II Mother    ??? Asthma Mother    ??? Diabetes Mother    ??? Hypertension Mother    ??? No Known Problems Father    ??? ADD / ADHD Brother    ??? Cancer Maternal Grandmother    ??? Glaucoma Neg Hx    ??? Macular degeneration Neg Hx    ??? Retinal detachment Neg Hx    ??? Strabismus Neg Hx ALLERGIES:    Allergies   Allergen Reactions   ??? Ocrelizumab Anaphylaxis

## 2020-08-08 ENCOUNTER — Encounter: Payer: Medicaid Other | Admitting: Student in an Organized Health Care Education/Training Program

## 2020-08-08 ENCOUNTER — Encounter
Admit: 2020-08-08 | Discharge: 2020-08-08 | Payer: PRIVATE HEALTH INSURANCE | Attending: Physician Assistant | Primary: Physician Assistant

## 2020-08-08 ENCOUNTER — Encounter: Admit: 2020-08-08 | Discharge: 2020-08-08 | Payer: PRIVATE HEALTH INSURANCE

## 2020-08-08 DIAGNOSIS — G35 Multiple sclerosis: Principal | ICD-10-CM

## 2020-08-08 LAB — HEPATIC FUNCTION PANEL
ALBUMIN: 3.5 g/dL (ref 3.4–5.0)
ALKALINE PHOSPHATASE: 148 U/L — ABNORMAL HIGH (ref 46–116)
ALT (SGPT): 18 U/L (ref 10–49)
AST (SGOT): 15 U/L (ref <=34)
BILIRUBIN DIRECT: <0.1 mg/dL (ref 0.00–0.30)
BILIRUBIN TOTAL: 0.2 mg/dL — ABNORMAL LOW (ref 0.3–1.2)
PROTEIN TOTAL: 6.7 g/dL (ref 5.7–8.2)

## 2020-08-08 LAB — CBC W/ AUTO DIFF
BASOPHILS ABSOLUTE COUNT: 0.1 10*9/L (ref 0.0–0.1)
BASOPHILS RELATIVE PERCENT: 0.9 %
EOSINOPHILS ABSOLUTE COUNT: 0.1 10*9/L (ref 0.0–0.5)
EOSINOPHILS RELATIVE PERCENT: 1.3 %
HEMATOCRIT: 37.6 % (ref 34.0–44.0)
HEMOGLOBIN: 13.2 g/dL (ref 11.3–14.9)
LYMPHOCYTES ABSOLUTE COUNT: 2.2 10*9/L (ref 1.1–3.6)
LYMPHOCYTES RELATIVE PERCENT: 21.3 %
MEAN CORPUSCULAR HEMOGLOBIN CONC: 34.9 g/dL (ref 32.3–35.0)
MEAN CORPUSCULAR HEMOGLOBIN: 29.4 pg (ref 25.9–32.4)
MEAN CORPUSCULAR VOLUME: 84.2 fL (ref 77.6–95.7)
MEAN PLATELET VOLUME: 9.3 fL (ref 7.3–10.7)
MONOCYTES ABSOLUTE COUNT: 0.6 10*9/L (ref 0.3–0.8)
MONOCYTES RELATIVE PERCENT: 5.7 %
NEUTROPHILS ABSOLUTE COUNT: 7.2 10*9/L — ABNORMAL HIGH (ref 1.5–6.4)
NEUTROPHILS RELATIVE PERCENT: 70.8 %
PLATELET COUNT: 326 10*9/L (ref 170–380)
RED BLOOD CELL COUNT: 4.47 10*12/L (ref 3.95–5.13)
RED CELL DISTRIBUTION WIDTH: 13.2 % (ref 12.2–15.2)
WBC ADJUSTED: 10.2 10*9/L (ref 4.2–10.2)

## 2020-08-08 MED ORDER — MODAFINIL 100 MG TABLET
ORAL_TABLET | Freq: Every day | ORAL | 2 refills | 60.00000 days | Status: CP
Start: 2020-08-08 — End: 2021-08-08

## 2020-08-08 NOTE — Unmapped (Addendum)
It was a pleasure seeing you today. Below is your visit summary:    **Have labs drawn today at:  [facility] Outpatient lab  [facility]   [address] [place], [place] [address]    **Increase Amyitripiline to 50 mg at bedtime if having muscle spasms or you can take Flexeril at bedtime.    **For fatigue start Provigil 100mg  ( 1 tablet) twice a day. Take it first thing in the morning and then again at noon. If ineffective after a few days may increase to 200mg  (2 tablets) twice a day.    **Schedule a visit with local mental health provider.    **Follow up 6 months.    Caid Radin Jannett Celestine PA-C, MPAS  Division of Multiple Sclerosis    **NEW ADDRESS [facility] at [facility]  [address], [address]  [place], [place] [address]    Phone [phone]  Fax [phone]    [facility] Neurology MS Clinic is a specialty clinic and there is a need for you to have a  primary care provider  who will take care of your non-neurological health.   Please set this up if you have not already done so.      ?? In case of:  ?? a suspected relapse (new symptoms or worsening existing symptoms, lasting for >24h)  OR  ?? a need for an additional appointment for other reasons  OR   ?? if you have other questions: please contact:       [facility] Desk   Phone: [phone]      ?? If you have questions for our  pharmacist, please call:      Worthy Flank, PharmD, CPP  Phone: [phone]      ?? If you need financial assistance, please call:      Financial Assistance Unit      Phone: [phone]

## 2020-08-09 LAB — VITAMIN D 25 HYDROXY: VITAMIN D, TOTAL (25OH): 25.9 ng/mL (ref 20.0–80.0)

## 2020-08-15 NOTE — Unmapped (Signed)
[facility] Specialty Pharm[facility] Refill Coordination Note    Specialty Medication(s) to be Shipped:   Neurology: Kesimpta    Other medication(s) to be shipped: No additional medications requested for fill at this time     Donna Mata, DOB: 01/28/2001  Phone: [phone] (home)       All above HIPAA information was verified with patient.     Was a Nurse, learning disability used for this call? No    Completed refill call assessment today to schedule patient's medication shipment from the [facility] [phone]).  All relevant notes have been reviewed.     Specialty medication(s) and dose(s) confirmed: Regimen is correct and unchanged.   Changes to medications: Dashanae reports no changes at this time.  Changes to insurance: No  New side effects reported not previously addressed with a pharmacist or physician: None reported  Questions for the pharmacist: No    Confirmed patient received a Conservation officer, historic buildings and a Surveyor, mining with first shipment. The patient will receive a drug information handout for each medication shipped and additional FDA Medication Guides as required.       DISEASE/MEDICATION-SPECIFIC INFORMATION        For patients on injectable medications: Patient currently has 0 doses left.  Next injection is scheduled for 5/12.    SPECIALTY MEDICATION ADHERENCE     Medication Adherence    Patient reported X missed doses in the last month: 0  Specialty Medication: Kesimpta  Patient is on additional specialty medications: No  Patient is on more than two specialty medications: No  Any gaps in refill history greater than 2 weeks in the last 3 months: no  Demonstrates understanding of importance of adherence: yes  Informant: patient   Other adherence tool: It is part of patient's routine.               Were doses missed due to medication being on hold? No    Kesimpta 20mg /0.13ml; Patient has 0 days of medication on hand    REFERRAL TO PHARMACIST     Referral to the pharmacist: Not needed      [facility]     Shipping address confirmed in Epic.     Delivery Scheduled: Yes, Expected medication delivery date: 5/10.     Medication will be delivered via UPS to the prescription address in Epic WAM.    [name]   [facility] Specialty Technician

## 2020-09-04 MED FILL — KESIMPTA PEN 20 MG/0.4 ML SUBCUTANEOUS PEN INJECTOR: 84 days supply | Qty: 1.2 | Fill #3

## 2020-09-07 MED ORDER — AMITRIPTYLINE 25 MG TABLET
ORAL_TABLET | 5 refills | 0 days
Start: 2020-09-07 — End: ?

## 2020-09-07 NOTE — Unmapped (Signed)
Last Visit Date:08/08/2020  Next Visit Date 02/09/2021    Last seen [name]  Last written 02/04/2020  Lab Results   Component Value Date    JC Virus AB POSITIVE 08/13/2016    Hep B Surface Ag Nonreactive 03/19/2019    Hep B S Ab Nonreactive 03/12/2018    Hep B Surf Ab Quant <8.00 03/12/2018    Hep B Core Total Ab Nonreactive 03/19/2019    Hepatitis C Ab Reactive (A) 07/06/2018    HIV Antigen/Antibody Combo Nonreactive 03/12/2018    HIV Antigen/Antibody Combo Nonreactive 07/21/2014        No results found for this or any previous visit.      No results found for this or any previous visit.      No results found for this or any previous visit.

## 2020-09-11 ENCOUNTER — Other Ambulatory Visit: Payer: Self-pay

## 2020-09-11 ENCOUNTER — Encounter: Payer: Self-pay | Admitting: Student in an Organized Health Care Education/Training Program

## 2020-09-11 ENCOUNTER — Ambulatory Visit
Payer: Medicaid Other | Attending: Student in an Organized Health Care Education/Training Program | Admitting: Student in an Organized Health Care Education/Training Program

## 2020-09-11 VITALS — BP 113/81 | HR 114 | Temp 97.2°F | Resp 16 | Ht 62.5 in | Wt 300.0 lb

## 2020-09-11 DIAGNOSIS — M792 Neuralgia and neuritis, unspecified: Secondary | ICD-10-CM | POA: Diagnosis not present

## 2020-09-11 DIAGNOSIS — G35 Multiple sclerosis: Secondary | ICD-10-CM | POA: Insufficient documentation

## 2020-09-11 DIAGNOSIS — R202 Paresthesia of skin: Secondary | ICD-10-CM | POA: Diagnosis not present

## 2020-09-11 DIAGNOSIS — G894 Chronic pain syndrome: Secondary | ICD-10-CM | POA: Insufficient documentation

## 2020-09-11 MED ORDER — MODAFINIL 200 MG TABLET
ORAL_TABLET | Freq: Two times a day (BID) | ORAL | 5 refills | 60.00000 days | Status: CP
Start: 2020-09-11 — End: 2020-09-11

## 2020-09-11 MED ORDER — TRAMADOL HCL 50 MG PO TABS: 50.0000 mg | ORAL_TABLET | Freq: Every day | ORAL | 2 refills | Status: AC | PRN

## 2020-09-11 NOTE — Progress Notes (Signed)
Nursing Pain Medication Assessment:  Safety precautions to be maintained throughout the outpatient stay will include: orient to surroundings, keep bed in low position, maintain call bell within reach at all times, provide assistance with transfer out of bed and ambulation.  Medication Inspection Compliance: Ms. Roediger did not comply with our request to bring her pills to be counted. She was reminded that bringing the medication bottles, even when empty, is a requirement.  Medication: None brought in. Pill/Patch Count: None available to be counted. Bottle Appearance: No container available. Did not bring bottle(s) to appointment. Filled Date: N/A Last Medication intake:  at least 1 month

## 2020-09-11 NOTE — Progress Notes (Signed)
PROVIDER NOTE: Information contained herein reflects review and annotations entered in association with encounter. Interpretation of such information and data should be left to medically-trained personnel. Information provided to patient can be located elsewhere in the medical record under "Patient Instructions". Document created using STT-dictation technology, any transcriptional errors that may result from process are unintentional.    Patient: Natasha [name]  Service Category: E/M  Provider: Gillis Santa, MD  DOB: Dec 03, 2000  DOS: 09/11/2020  Specialty: Interventional Pain Management  MRN: [id number]  Setting: Ambulatory outpatient  PCP: Wayland Denis, PA-C  Type: Established Patient    Referring Provider: Wayland Denis, PA-C  Location: Office  Delivery: Face-to-face     HPI  [name], a 20 y.o. year old female, is here today because of her Multiple sclerosis ([place]) [G35]. [name] primary complain today is Other (Multiple sclerosis, joint pain. ) Last encounter: My last encounter with her was on 08/08/2020. Pertinent problems: [name] has Multiple sclerosis ([place]); Chronic pain syndrome; Neuropathic pain; Paresthesias; and Anxiety disorder on their pertinent problem list. Pain Assessment: Severity of Chronic pain is reported as a 5 /10. Location: Other (Comment) (joint pain from MS)  /shoots from the joint area to other areas.. Onset: More than a month ago. Quality: Aching,Constant,Discomfort,Sharp. Timing: Constant. Modifying factor(s): medications. Vitals:  height is 5' 2.5" (1.588 m) and weight is 300 lb (136.1 kg). Her temporal temperature is 97.2 F (36.2 C) (abnormal). Her blood pressure is 113/81 and her pulse is 114 (abnormal). Her respiration is 16 and oxygen saturation is 100%.   Reason for encounter: medication management.   Patient presents today for medication management.  No significant change in her medical history since her last visit with me.  She  did see her neurologist for multiple sclerosis management.  Everything was stable.  Her neurologist recommend that she increase her amitriptyline to 75 mg nightly and also added Flexeril for lower extremity muscle spasms at night.  Patient states that she has been without her tramadol for the last month and her overall pain has been more difficult to manage.  She does find functional benefit and pain relief with tramadol.  No issues with constipation.  We will refill as below.  Follow-up in 3 months.  We will also repeat urine toxicology screen for medication compliance and monitoring.  Also counseled patient on the importance of dieting, exercise, weight loss as it pertains to chronic pain management.  Pharmacotherapy Assessment   Analgesic: Tramadol 30 mg daily as needed, quantity 30/month    Monitoring: [place] PMP: PDMP reviewed during this encounter.       Pharmacotherapy: No side-effects or adverse reactions reported. Compliance: No problems identified. Effectiveness: Clinically acceptable.  Janett Billow, RN  09/11/2020  1:42 PM  Sign when Signing Visit Nursing Pain Medication Assessment:  Safety precautions to be maintained throughout the outpatient stay will include: orient to surroundings, keep bed in low position, maintain call bell within reach at all times, provide assistance with transfer out of bed and ambulation.  Medication Inspection Compliance: [name] did not comply with our request to bring her pills to be counted. She was reminded that bringing the medication bottles, even when empty, is a requirement.  Medication: None brought in. Pill/Patch Count: None available to be counted. Bottle Appearance: No container available. Did not bring bottle(s) to appointment. Filled Date: N/A Last Medication intake:  at least 1 month    UDS:  Summary  Date Value Ref Range Status  08/10/2019 Note  Final    Comment:     ==================================================================== Compliance Drug Analysis, Ur ==================================================================== Test                             Result       Flag       Units Drug Present and Declared for Prescription Verification   Amitriptyline                  PRESENT      EXPECTED   Nortriptyline                  PRESENT      EXPECTED    Nortriptyline is an expected metabolite of amitriptyline.   Naproxen                       PRESENT      EXPECTED Drug Present not Declared for Prescription Verification   Ibuprofen                      PRESENT      UNEXPECTED   Diphenhydramine                PRESENT      UNEXPECTED Drug Absent but Declared for Prescription Verification   Tramadol                       Not Detected UNEXPECTED ng/mg creat   Carbamazepine                  Not Detected UNEXPECTED   Gabapentin                     Not Detected UNEXPECTED   Cyclobenzaprine                Not Detected UNEXPECTED   Acetaminophen                  Not Detected UNEXPECTED    Acetaminophen, as indicated in the declared medication list, is not    always detected even when used as directed.   Promethazine                   Not Detected UNEXPECTED ==================================================================== Test                      Result    Flag   Units      Ref Range   Creatinine              44               mg/dL      >=20 ==================================================================== Declared Medications:  The flagging and interpretation on this report are based on the  following declared medications.  Unexpected results may arise from  inaccuracies in the declared medications.  **Note: The testing scope of this panel includes these medications:  Amitriptyline (Elavil)  Carbamazepine (Carbatrol)  Cyclobenzaprine (Flexeril)  Gabapentin (Neurontin)  Naproxen (Naprosyn)  Promethazine (Phenergan)  Tramadol (Ultram)   **Note: The testing scope of this panel does not include small to  moderate amounts of these reported medications:  Acetaminophen (Tylenol)  **Note: The testing scope of this panel does not include the  following reported medications:  Bacitracin  Cholecalciferol  Ethinyl Estradiol (Xulane)  Fingolimod  Metformin (Glucophage)  Norelgestromin (Xulane)  Ofatumumab (Kesimpta) ====================================================================  For clinical consultation, please call [phone]. ====================================================================      ROS  Constitutional: Denies any fever or chills Gastrointestinal: No reported hemesis, hematochezia, vomiting, or acute GI distress Musculoskeletal: Denies any acute onset joint swelling, redness, loss of ROM, or weakness Neurological: No reported episodes of acute onset apraxia, aphasia, dysarthria, agnosia, amnesia, paralysis, loss of coordination, or loss of consciousness  Medication Review  Cholecalciferol, Ofatumumab, amitriptyline, buPROPion, cyclobenzaprine, naproxen, pantoprazole, promethazine, traMADol, and traZODone  History Review  Allergy: [name] has No Known Allergies. Drug: [name]  reports no history of drug use. Alcohol:  reports no history of alcohol use. Tobacco:  reports that she has never smoked. She has never used smokeless tobacco. Social: [name]  reports that she has never smoked. She has never used smokeless tobacco. She reports that she does not drink alcohol and does not use drugs. Medical:  has a past medical history of Multiple sclerosis ([place]) (06/2014). Surgical: [name]  has no past surgical history on file. Family: family history includes ADD / ADHD in her brother and mother; Alcohol abuse in her maternal grandfather and maternal grandmother; Depression in her mother.  Laboratory Chemistry Profile   Renal Lab Results  Component Value Date   BUN 14  04/29/2019   CREATININE 0.50 04/29/2019   GFRAA >60 04/29/2019   GFRNONAA >60 04/29/2019     Hepatic No results found for: AST, ALT, ALBUMIN, ALKPHOS, HCVAB, AMYLASE, LIPASE, AMMONIA   Electrolytes Lab Results  Component Value Date   NA 137 04/29/2019   K 3.8 04/29/2019   CL 104 04/29/2019   CALCIUM 8.8 (L) 04/29/2019     Bone No results found for: VD25OH, IF027XA1OIN, OM7672CN4, BS9628ZM6, 25OHVITD1, 25OHVITD2, 25OHVITD3, TESTOFREE, TESTOSTERONE   Inflammation (CRP: Acute Phase) (ESR: Chronic Phase) No results found for: CRP, ESRSEDRATE, LATICACIDVEN     Note: Above Lab results reviewed.  Recent Imaging Review  Note: Reviewed        Physical Exam  General appearance: Well nourished, well developed, and well hydrated. In no apparent acute distress Mental status: Alert, oriented x 3 (person, place, & time)       Respiratory: No evidence of acute respiratory distress Eyes: PERLA Vitals: BP 113/81 (BP Location: Right Arm, Patient Position: Sitting, Cuff Size: Large)   Pulse (!) 114   Temp (!) 97.2 F (36.2 C) (Temporal)   Resp 16   Ht 5' 2.5" (1.588 m)   Wt 300 lb (136.1 kg)   LMP 09/02/2020   SpO2 100%   BMI 54.00 kg/m  BMI: Estimated body mass index is 54 kg/m as calculated from the following:   Height as of this encounter: 5' 2.5" (1.588 m).   Weight as of this encounter: 300 lb (136.1 kg). Ideal: Ideal body weight: 51.3 kg (112 lb 15.8 oz) Adjusted ideal body weight: 85.2 kg (187 lb 12.7 oz)   Mid and low back pain.  Pain with lateral rotation.  Normal gait. 5 out of 5 strength bilateral lower extremity: Plantar flexion, dorsiflexion, knee flexion, knee extension. Lower extremity paresthesias  Assessment   Status Diagnosis  Controlled Controlled Controlled 1. Multiple sclerosis (HCC)   2. Paresthesias   3. Neuropathic pain   4. Chronic pain syndrome      Plan of Care  Natasha [name] has a current medication list which includes the following  long-term medication(s): amitriptyline, bupropion, pantoprazole, promethazine, trazodone, and tramadol.  Pharmacotherapy (Medications Ordered): Meds ordered this encounter  Medications  . traMADol Veatrice Bourbon)  50 MG tablet    Sig: Take 1 tablet (50 mg total) by mouth daily as needed.    Dispense:  30 tablet    Refill:  2  Continue amitriptyline and Flexeril as prescribed by neurology  Orders:  Orders Placed This Encounter  Procedures  . ToxASSURE Select 13 (MW), Urine    Volume: 30 ml(s). Minimum 3 ml of urine is needed. Document temperature of fresh sample. Indications: Long term (current) use of opiate analgesic [phone])    Order Specific Question:   Release to patient    Answer:   Immediate   Follow-up plan:   Return in about 3 months (around 12/12/2020) for Medication Management, in person.    Recent Visits No visits were found meeting these conditions. Showing recent visits within past 90 days and meeting all other requirements Today's Visits Date Type Provider Dept  09/11/20 Office Visit Gillis Santa, MD [facility]  Showing today's visits and meeting all other requirements Future Appointments No visits were found meeting these conditions. Showing future appointments within next 90 days and meeting all other requirements  I discussed the assessment and treatment plan with the patient. The patient was provided an opportunity to ask questions and all were answered. The patient agreed with the plan and demonstrated an understanding of the instructions.  Patient advised to call back or seek an in-person evaluation if the symptoms or condition worsens.  Duration of encounter:60minutes.  Note by: Gillis Santa, MD Date: 09/11/2020; Time: 1:54 PM

## 2020-09-12 DIAGNOSIS — R5382 Chronic fatigue, unspecified: Principal | ICD-10-CM

## 2020-09-16 LAB — TOXASSURE SELECT 13 (MW), URINE

## 2020-09-18 ENCOUNTER — Telehealth: Payer: Self-pay

## 2020-09-18 ENCOUNTER — Encounter: Payer: Self-pay | Admitting: Student in an Organized Health Care Education/Training Program

## 2020-09-18 NOTE — Progress Notes (Unsigned)
Patient's urine toxicology screen positive for cocaine metabolite.  Blatant violation of pain contract.  Please cancel any remaining tramadol prescriptions at her pharmacy.  We will be mailing a letter to her explaining violation of her pain contract.   Pharmacotherapy Assessment   Analgesic: Tramadol 30 mg daily as needed, quantity 30/month      UDS:  Summary  Date Value Ref Range Status  09/11/2020 Note  Final    Comment:    ==================================================================== ToxASSURE Select 13 (MW) ==================================================================== Test                             Result       Flag       Units  Drug Present not Declared for Prescription Verification   Benzoylecgonine                43           UNEXPECTED ng/mg creat    Benzoylecgonine is a metabolite of cocaine; its presence indicates    use of this drug.  Source is most commonly illicit, but cocaine is    present in some topical anesthetic solutions.  Drug Absent but Declared for Prescription Verification   Tramadol                       Not Detected UNEXPECTED ng/mg creat ==================================================================== Test                      Result    Flag   Units      Ref Range   Creatinine              122              mg/dL      >=51 ==================================================================== Declared Medications:  The flagging and interpretation on this report are based on the  following declared medications.  Unexpected results may arise from  inaccuracies in the declared medications.   **Note: The testing scope of this panel includes these medications:   Tramadol (Ultram)   **Note: The testing scope of this panel does not include the  following reported medications:   Amitriptyline (Elavil)  Bupropion (Wellbutrin)  Cholecalciferol  Cyclobenzaprine (Flexeril)  Naproxen (Naprosyn)  Ofatumumab (Kesimpta)  Pantoprazole  (Protonix)  Promethazine (Phenergan)  Trazodone (Desyrel) ==================================================================== For clinical consultation, please call [phone]. ====================================================================

## 2020-09-18 NOTE — Telephone Encounter (Signed)
Prescriptions  For Tramadol cancelled per [facility] at [facility] pharmacy

## 2020-09-18 NOTE — Progress Notes (Signed)
Urine tox + cocaine metabolites. Violation of pain contract.  Pharmacotherapy Assessment   Analgesic: Tramadol 30 mg daily as needed, quantity 30/month      UDS:  Summary  Date Value Ref Range Status  09/11/2020 Note  Final    Comment:    ==================================================================== ToxASSURE Select 13 (MW) ==================================================================== Test                             Result       Flag       Units  Drug Present not Declared for Prescription Verification   Benzoylecgonine                43           UNEXPECTED ng/mg creat    Benzoylecgonine is a metabolite of cocaine; its presence indicates    use of this drug.  Source is most commonly illicit, but cocaine is    present in some topical anesthetic solutions.  Drug Absent but Declared for Prescription Verification   Tramadol                       Not Detected UNEXPECTED ng/mg creat ==================================================================== Test                      Result    Flag   Units      Ref Range   Creatinine              122              mg/dL      >=19 ==================================================================== Declared Medications:  The flagging and interpretation on this report are based on the  following declared medications.  Unexpected results may arise from  inaccuracies in the declared medications.   **Note: The testing scope of this panel includes these medications:   Tramadol (Ultram)   **Note: The testing scope of this panel does not include the  following reported medications:   Amitriptyline (Elavil)  Bupropion (Wellbutrin)  Cholecalciferol  Cyclobenzaprine (Flexeril)  Naproxen (Naprosyn)  Ofatumumab (Kesimpta)  Pantoprazole (Protonix)  Promethazine (Phenergan)  Trazodone (Desyrel) ==================================================================== For clinical consultation, please call [phone]. ====================================================================

## 2020-10-05 ENCOUNTER — Encounter: Payer: Self-pay | Admitting: Student in an Organized Health Care Education/Training Program

## 2020-10-30 ENCOUNTER — Other Ambulatory Visit: Payer: Self-pay | Admitting: Student in an Organized Health Care Education/Training Program

## 2020-10-30 MED ORDER — PROMETHAZINE 12.5 MG TABLET
ORAL_TABLET | Freq: Four times a day (QID) | ORAL | 2 refills | 0 days | PRN
Start: 2020-10-30 — End: ?

## 2020-10-31 NOTE — Unmapped (Signed)
Last Visit Date: 08/08/2020  Next Visit Date: 01/30/2021    Last seen [facility]    Lab Results   Component Value Date    JC Virus AB POSITIVE 08/13/2016    Hep B Surface Ag Nonreactive 03/19/2019    Hep B S Ab Nonreactive 03/12/2018    Hep B Surf Ab Quant <8.00 03/12/2018    Hep B Core Total Ab Nonreactive 03/19/2019    Hepatitis C Ab Reactive (A) 07/06/2018    HIV Antigen/Antibody Combo Nonreactive 03/12/2018    HIV Antigen/Antibody Combo Nonreactive 07/21/2014        No results found for this or any previous visit.      No results found for this or any previous visit.      No results found for this or any previous visit.

## 2020-11-01 MED ORDER — PROMETHAZINE 12.5 MG TABLET
ORAL_TABLET | Freq: Four times a day (QID) | ORAL | 2 refills | 8 days | Status: CP | PRN
Start: 2020-11-01 — End: ?

## 2020-11-05 MED ORDER — CHOLECALCIFEROL (VITAMIN D3) 1,250 MCG (50,000 UNIT) CAPSULE
ORAL_CAPSULE | 5 refills | 0 days
Start: 2020-11-05 — End: ?

## 2020-11-06 NOTE — Unmapped (Signed)
Last Visit Date: 08/08/2020  Next Visit Date: 02/09/2021    Last seen [facility]    Lab Results   Component Value Date    JC Virus AB POSITIVE 08/13/2016    Hep B Surface Ag Nonreactive 03/19/2019    Hep B S Ab Nonreactive 03/12/2018    Hep B Surf Ab Quant <8.00 03/12/2018    Hep B Core Total Ab Nonreactive 03/19/2019    Hepatitis C Ab Reactive (A) 07/06/2018    HIV Antigen/Antibody Combo Nonreactive 03/12/2018    HIV Antigen/Antibody Combo Nonreactive 07/21/2014        No results found for this or any previous visit.      No results found for this or any previous visit.      No results found for this or any previous visit.

## 2020-11-07 DIAGNOSIS — G35 Multiple sclerosis: Principal | ICD-10-CM

## 2020-11-07 MED ORDER — CHOLECALCIFEROL (VITAMIN D3) 1,250 MCG (50,000 UNIT) CAPSULE
ORAL_CAPSULE | 5 refills | 0 days
Start: 2020-11-07 — End: ?

## 2020-11-08 MED ORDER — KESIMPTA PEN 20 MG/0.4 ML SUBCUTANEOUS PEN INJECTOR
1 refills | 0 days | Status: CP
Start: 2020-11-08 — End: ?
  Filled 2020-11-29: qty 1.2, 84d supply, fill #0

## 2020-11-08 NOTE — Unmapped (Signed)
Last Visit Date: 08/08/2020  Next Visit Date: 02/09/2021    Last seen [facility]    Lab Results   Component Value Date    JC Virus AB POSITIVE 08/13/2016    Hep B Surface Ag Nonreactive 03/19/2019    Hep B S Ab Nonreactive 03/12/2018    Hep B Surf Ab Quant <8.00 03/12/2018    Hep B Core Total Ab Nonreactive 03/19/2019    Hepatitis C Ab Reactive (A) 07/06/2018    HIV Antigen/Antibody Combo Nonreactive 03/12/2018    HIV Antigen/Antibody Combo Nonreactive 07/21/2014        No results found for this or any previous visit.      No results found for this or any previous visit.      No results found for this or any previous visit.

## 2020-11-20 NOTE — Unmapped (Signed)
[facility] [place] [facility] Clinical Assessment & Refill Coordination Note    Kai Railsback, [place]: April 11, 2001  Phone: [phone] (home)     All above HIPAA information was verified with patient.     Was a Nurse, learning disability used for this call? No    Specialty Medication(s):   Neurology: Kesimpta     Current Outpatient Medications   Medication Sig Dispense Refill   ??? acetaminophen (TYLENOL ORAL) Take by mouth.     ??? amitriptyline (ELAVIL) 25 MG tablet TAKE 3 TABLETS BY MOUTH EVERY DAY AT BEDTIME 90 tablet 5   ??? buPROPion (WELLBUTRIN XL) 300 MG 24 hr tablet TAKE 1 TABLET BY MOUTH EVERY DAY IN THE MORNING 30 tablet 5   ??? cholecalciferol, vitamin D3, 100 mcg (4,000 unit) cap Take by mouth.     ??? cyclobenzaprine (FLEXERIL) 5 MG tablet Take by mouth.     ??? diphenhydramine HCl (BENADRYL ORAL) Take by mouth.     ??? modafiniL (PROVIGIL) 200 MG tablet Take 0.5 tablets (100 mg total) by mouth two (2) times a day. 30 tablet 5   ??? naproxen (NAPROSYN) 500 MG tablet TAKE 1 TABLET BY MOUTH TWICE A DAY WITH MEALS     ??? ofatumumab (KESIMPTA PEN) 20 mg/0.4 mL PnIj Inject contents of 1 syringe (20 mg) under the skin every 28 days. 1.2 mL 1   ??? pantoprazole (PROTONIX) 20 MG tablet Take 1 tablet (20 mg total) by mouth two (2) times a day. 180 tablet 1   ??? promethazine (PHENERGAN) 12.5 MG tablet TAKE 1 TABLET (12.5 MG TOTAL) BY MOUTH EVERY SIX (6) HOURS AS NEEDED FOR NAUSEA. 30 tablet 2   ??? traZODone (DESYREL) 100 MG tablet Take 100 mg by mouth.       No current facility-administered medications for this visit.        Changes to medications: Emmalie reports no changes at this time.    Allergies   Allergen Reactions   ??? Ocrelizumab Anaphylaxis       Changes to allergies: No    SPECIALTY MEDICATION ADHERENCE     Kesimpta 20 mg/0.38ml: 0 days of medicine on hand     Medication Adherence    Patient reported X missed doses in the last month: 0  Specialty Medication: Kesimpta  Patient is on additional specialty medications: No  Informant: patient   Other adherence tool: It is part of patient's routine.           Specialty medication(s) dose(s) confirmed: Regimen is correct and unchanged.     Are there any concerns with adherence? No    Adherence counseling provided? Not needed    CLINICAL MANAGEMENT AND INTERVENTION      Clinical Benefit Assessment:    Do you feel the medicine is effective or helping your condition? Yes    Clinical Benefit counseling provided? Not needed    Adverse Effects Assessment:    Are you experiencing any side effects? No    Are you experiencing difficulty administering your medicine? No    Quality of Life Assessment:    Quality of Life    Rheumatology  On a scale of 1 - 10 with 1 representing not at all and 10 representing completely - how has your rheumatologic condition affected your:  Oncology  Dermatology          How many days over the past month did your MS  keep you from your normal activities? For example, brushing your teeth or  getting up in the morning. 0    Have you discussed this with your provider? Not needed    Acute Infection Status:    Acute infections noted within Epic:  No active infections  Patient reported infection: None    Therapy Appropriateness:    Is therapy appropriate? Yes, therapy is appropriate and should be continued    DISEASE/MEDICATION-SPECIFIC INFORMATION      For patients on injectable medications: Patient currently has 0 doses left.  Next injection is scheduled for 12/10/20.    PATIENT SPECIFIC NEEDS     - Does the patient have any physical, cognitive, or cultural barriers? No    - Is the patient high risk? No    - Does the patient require a Care Management Plan? No     - Does the patient require physician intervention or other additional services (i.e. nutrition, smoking cessation, social work)? No      SHIPPING     Specialty Medication(s) to be Shipped:   Neurology: Kesimpta    Other medication(s) to be shipped: No additional medications requested for fill at this time Changes to insurance: No    Delivery Scheduled: Yes, Expected medication delivery date: 11/30/20.     Medication will be delivered via UPS to the confirmed prescription address in [facility].    The patient will receive a drug information handout for each medication shipped and additional FDA Medication Guides as required.  Verified that patient has previously received a Conservation officer, historic buildings and a Surveyor, mining.    The patient or caregiver noted above participated in the development of this care plan and knows that they can request review of or adjustments to the care plan at any time.      All of the patient's questions and concerns have been addressed.    Arnold Long   [facility] Specialty Pharmacist

## 2020-11-30 DIAGNOSIS — G35 Multiple sclerosis: Principal | ICD-10-CM

## 2020-11-30 MED ORDER — BUPROPION HCL XL 300 MG 24 HR TABLET, EXTENDED RELEASE
ORAL_TABLET | 5 refills | 0 days | Status: CP
Start: 2020-11-30 — End: ?

## 2020-11-30 NOTE — Unmapped (Signed)
Last Visit Date: 08/08/2020  Next Visit Date: 02/09/2021    Lab Results   Component Value Date    JC Virus AB POSITIVE 08/13/2016    Hep B Surface Ag Nonreactive 03/19/2019    Hep B S Ab Nonreactive 03/12/2018    Hep B Surf Ab Quant <8.00 03/12/2018    Hep B Core Total Ab Nonreactive 03/19/2019    Hepatitis C Ab Reactive (A) 07/06/2018    HIV Antigen/Antibody Combo Nonreactive 03/12/2018    HIV Antigen/Antibody Combo Nonreactive 07/21/2014        No results found for this or any previous visit.      No results found for this or any previous visit.      No results found for this or any previous visit.

## 2020-12-12 ENCOUNTER — Encounter: Payer: Medicaid Other | Admitting: Student in an Organized Health Care Education/Training Program

## 2021-01-11 MED ORDER — PANTOPRAZOLE 20 MG TABLET,DELAYED RELEASE
ORAL_TABLET | 1 refills | 0.00000 days
Start: 2021-01-11 — End: ?

## 2021-01-12 MED ORDER — PANTOPRAZOLE 20 MG TABLET,DELAYED RELEASE
ORAL_TABLET | 1 refills | 0 days
Start: 2021-01-12 — End: ?

## 2021-01-12 NOTE — Unmapped (Signed)
Request received via interface.     Provider: [name]    Last Visit Date: 08/08/2020    Next Visit Date: 02/09/2021    Lab Results   Component Value Date    JC Virus AB POSITIVE 08/13/2016    Hep B Surface Ag Nonreactive 03/19/2019    Hep B S Ab Nonreactive 03/12/2018    Hep B Surf Ab Quant <8.00 03/12/2018    Hep B Core Total Ab Nonreactive 03/19/2019    Hepatitis C Ab Reactive (A) 07/06/2018    HIV Antigen/Antibody Combo Nonreactive 03/12/2018    HIV Antigen/Antibody Combo Nonreactive 07/21/2014        No results found for this or any previous visit.      No results found for this or any previous visit.      No results found for this or any previous visit.

## 2021-01-13 NOTE — Unmapped (Signed)
Unclear if the patient needs this medication now. Should talk to her PCP.     Donna Mata

## 2021-01-14 MED ORDER — PROMETHAZINE 12.5 MG TABLET
ORAL_TABLET | Freq: Four times a day (QID) | ORAL | 2 refills | 0 days | PRN
Start: 2021-01-14 — End: ?

## 2021-01-15 MED ORDER — PROMETHAZINE 12.5 MG TABLET
ORAL_TABLET | Freq: Four times a day (QID) | ORAL | 2 refills | 8 days | Status: CP | PRN
Start: 2021-01-15 — End: ?

## 2021-01-15 NOTE — Unmapped (Signed)
Please let the patient know to talk to the PCP if she still needs this medication. Thanks, Dr. Algis Downs

## 2021-01-15 NOTE — Unmapped (Signed)
Last Visit Date: 08/08/2020  Next Visit Date: 02/09/2021    Last seen [facility]  Lab Results   Component Value Date    JC Virus AB POSITIVE 08/13/2016    Hep B Surface Ag Nonreactive 03/19/2019    Hep B S Ab Nonreactive 03/12/2018    Hep B Surf Ab Quant <8.00 03/12/2018    Hep B Core Total Ab Nonreactive 03/19/2019    Hepatitis C Ab Reactive (A) 07/06/2018    HIV Antigen/Antibody Combo Nonreactive 03/12/2018    HIV Antigen/Antibody Combo Nonreactive 07/21/2014        No results found for this or any previous visit.      No results found for this or any previous visit.      No results found for this or any previous visit.

## 2021-01-29 MED ORDER — PANTOPRAZOLE 20 MG TABLET,DELAYED RELEASE
ORAL_TABLET | 1 refills | 0 days | Status: CP
Start: 2021-01-29 — End: ?

## 2021-01-29 NOTE — Unmapped (Signed)
Last Visit Date: 08/08/2020  Next Visit Date: 02/09/2021    No results found for: CBC, CMP     No results found for this or any previous visit.

## 2021-02-26 NOTE — Unmapped (Signed)
[facility] Refill Coordination Note    Specialty Medication(s) to be Shipped:   Neurology: Kesimpta    Other medication(s) to be shipped: No additional medications requested for fill at this time     Donna Mata, DOB: 09/12/2000  Phone: [phone] (home)       All above HIPAA information was verified with patient's family member, mom.     Was a Nurse, learning disability used for this call? No    Completed refill call assessment today to schedule patient's medication shipment from the [facility] [phone]).  All relevant notes have been reviewed.     Specialty medication(s) and dose(s) confirmed: Regimen is correct and unchanged.   Changes to medications: Donna Mata reports no changes at this time.  Changes to insurance: No  New side effects reported not previously addressed with a pharmacist or physician: None reported  Questions for the pharmacist: No    Confirmed patient received a Conservation officer, historic buildings and a Surveyor, mining with first shipment. The patient will receive a drug information handout for each medication shipped and additional FDA Medication Guides as required.       DISEASE/MEDICATION-SPECIFIC INFORMATION        For patients on injectable medications: Patient currently has 0 doses left.  Next injection is scheduled for 11/12.    SPECIALTY MEDICATION ADHERENCE     Medication Adherence    Patient reported X missed doses in the last month: 0  Specialty Medication: [name]  Patient is on additional specialty medications: No  Patient is on more than two specialty medications: No  Any gaps in refill history greater than 2 weeks in the last 3 months: no  Demonstrates understanding of importance of adherence: yes  Informant: patient   Other adherence tool: It is part of patient's routine.               Were doses missed due to medication being on hold? No    Kesimpta 20mg /0.46ml: Patient has 0 days of medication on hand    REFERRAL TO PHARMACIST     Referral to the pharmacist: Not needed      [name]     Shipping address confirmed in Epic.     Delivery Scheduled: Yes, Expected medication delivery date: 11/8.     Medication will be delivered via UPS to the prescription address in Epic WAM.    Donna Mata   [facility] Specialty Technician

## 2021-02-27 MED ORDER — AMITRIPTYLINE 25 MG TABLET
ORAL_TABLET | 5 refills | 0 days
Start: 2021-02-27 — End: ?

## 2021-02-28 NOTE — Unmapped (Signed)
Last Visit Date: 08/08/2020  Next Visit Date: No follow up appointment scheduled      Last seen [facility]  Lab Results   Component Value Date    JC Virus AB POSITIVE 08/13/2016    Hep B Surface Ag Nonreactive 03/19/2019    Hep B S Ab Nonreactive 03/12/2018    Hep B Surf Ab Quant <8.00 03/12/2018    Hep B Core Total Ab Nonreactive 03/19/2019    Hepatitis C Ab Reactive (A) 07/06/2018    HIV Antigen/Antibody Combo Nonreactive 03/12/2018    HIV Antigen/Antibody Combo Nonreactive 07/21/2014        No results found for this or any previous visit.      No results found for this or any previous visit.      No results found for this or any previous visit.

## 2021-03-01 MED ORDER — AMITRIPTYLINE 25 MG TABLET
ORAL_TABLET | ORAL | 0 refills | 0.00000 days | Status: CP
Start: 2021-03-01 — End: ?

## 2021-03-01 MED ORDER — PROMETHAZINE 12.5 MG TABLET
ORAL_TABLET | Freq: Four times a day (QID) | ORAL | 2 refills | 0.00000 days | PRN
Start: 2021-03-01 — End: ?

## 2021-03-02 NOTE — Unmapped (Signed)
Last Visit Date: 08/08/2020  Next Visit Date: 03/16/2021      Last seen by [facility]  Lab Results   Component Value Date    JC Virus AB POSITIVE 08/13/2016    Hep B Surface Ag Nonreactive 03/19/2019    Hep B S Ab Nonreactive 03/12/2018    Hep B Surf Ab Quant <8.00 03/12/2018    Hep B Core Total Ab Nonreactive 03/19/2019    Hepatitis C Ab Reactive (A) 07/06/2018    HIV Antigen/Antibody Combo Nonreactive 03/12/2018    HIV Antigen/Antibody Combo Nonreactive 07/21/2014        No results found for this or any previous visit.      No results found for this or any previous visit.      No results found for this or any previous visit.

## 2021-03-04 MED ORDER — PROMETHAZINE 12.5 MG TABLET
ORAL_TABLET | Freq: Four times a day (QID) | ORAL | 2 refills | 8 days | Status: CP | PRN
Start: 2021-03-04 — End: ?

## 2021-03-05 MED FILL — KESIMPTA PEN 20 MG/0.4 ML SUBCUTANEOUS PEN INJECTOR: 84 days supply | Qty: 1.2 | Fill #1

## 2021-03-08 MED ORDER — AMITRIPTYLINE 25 MG TABLET
ORAL_TABLET | 0 refills | 0.00000 days
Start: 2021-03-08 — End: ?

## 2021-03-09 MED ORDER — AMITRIPTYLINE 25 MG TABLET
ORAL_TABLET | ORAL | 5 refills | 0.00000 days | Status: CP
Start: 2021-03-09 — End: ?

## 2021-03-09 NOTE — Unmapped (Signed)
Last Visit Date: 08/08/2020   Next Visit Date: Visit date not found    No results found for: CBC, CMP     No results found for this or any previous visit.

## 2021-03-09 NOTE — Unmapped (Signed)
called to get 11/18 appt reschedule with [name].  Left message for patient to call us back.

## 2021-03-13 NOTE — Unmapped (Signed)
inbasket pt advice req: lvm to schd appt with zelasky Notes: schedulers Can we please make 11/18 appt with Donna Mata virtual and let patient know? If this appt is not longer available please make another appt virtual.  Maia Plan

## 2021-04-02 MED ORDER — MODAFINIL 200 MG TABLET
ORAL_TABLET | Freq: Two times a day (BID) | ORAL | 5 refills | 30.00000 days | Status: CP
Start: 2021-04-02 — End: ?

## 2021-04-02 NOTE — Unmapped (Signed)
Called to schedule follow up appointment per [facility] request. Voicemail not set up sent a text.

## 2021-04-02 NOTE — Unmapped (Signed)
Last Visit Date: 08/08/2020  Next Visit Date: 04/04/2021      Last seen by [facility]  Lab Results   Component Value Date    JC Virus AB POSITIVE 08/13/2016    Hep B Surface Ag Nonreactive 03/19/2019    Hep B S Ab Nonreactive 03/12/2018    Hep B Surf Ab Quant <8.00 03/12/2018    Hep B Core Total Ab Nonreactive 03/19/2019    Hepatitis C Ab Reactive (A) 07/06/2018    HIV Antigen/Antibody Combo Nonreactive 03/12/2018    HIV Antigen/Antibody Combo Nonreactive 07/21/2014        No results found for this or any previous visit.      No results found for this or any previous visit.      No results found for this or any previous visit.

## 2021-04-02 NOTE — Unmapped (Signed)
Neurology update:  Patient is past due for clinic visit.  My chart message and /or letter sent to patient.  Staff asked to call and schedule.

## 2021-04-06 NOTE — Unmapped (Signed)
Patient should have enough medication on hand, rescheduling refill call for 1/20

## 2021-04-18 ENCOUNTER — Telehealth
Admit: 2021-04-18 | Discharge: 2021-04-19 | Payer: PRIVATE HEALTH INSURANCE | Attending: Physician Assistant | Primary: Physician Assistant

## 2021-04-18 DIAGNOSIS — G35 Multiple sclerosis: Principal | ICD-10-CM

## 2021-04-18 MED ORDER — CYCLOBENZAPRINE 5 MG TABLET
ORAL_TABLET | Freq: Every evening | ORAL | 1 refills | 30.00000 days | Status: CP | PRN
Start: 2021-04-18 — End: ?

## 2021-04-18 NOTE — Unmapped (Signed)
[facility]  Multiple Sclerosis / Neuroimmunology Division  [facility]  Physician Assistant, Cordelia [place], [place]  Phone: [phone]  Fax: [phone]      Patient Name: Donna Mata   Date of Birth: Sep 08, 2000  Medical Record Number: [id number]  [address] [place] [address]     Direct entry by:  Cy Blamer, PA-C, MPAS.  Supervising Physician: Dr. Desma Mcgregor, Dr. Quillian Quince.    DATE OF VISIT: April 18, 2021    REASON FOR PATIENT OUTREACH /  video CALL ENCOUNTER: Follow up for evaluation of  Multiple Sclerosis / Demyelinating Disease / or other Neurological problem.          The patient reports they are currently: at home. I spent 20 minutes on the real-time audio and video visit with the patient on the date of service. I spent an additional 10 minutes on pre- and post-visit activities on the date of service.     The patient was not located and I was located within 250 yards of a hospital based location during the real-time audio and video visit. The patient was physically located in [place] or a state in which I am permitted to provide care. The patient and/or parent/guardian understood that s/he may incur co-pays and cost sharing, and agreed to the telemedicine visit. The visit was reasonable and appropriate under the circumstances given the patient's presentation at the time.    The patient and/or parent/guardian has been advised of the potential risks and limitations of this mode of treatment (including, but not limited to, the absence of in-person examination) and has agreed to be treated using telemedicine. The patient's/patient's family's questions regarding telemedicine have been answered.    If the visit was completed in an ambulatory setting, the patient and/or parent/guardian has also been advised to contact their provider???s office for worsening conditions, and seek emergency medical treatment and/or call 911 if the patient deems either necessary.           **Seen by Dr. Johnnye Lana 04/24/2016.    ASSESSMENT AND PLAN:  ** Relapsing Remitting Multiple Sclerosis.  -Started Kesimpta 04/12/2019.    -Has appointment for 05/04/2021 for her MRI's.  -Check CBC/diff , IgG at Primary Providers office (PCP).    -Reminder to stay up to date on all vaccines including Flu and COVID Bivalent booster and if not previously obtained the shingles and Pneumonia vaccine. Stay current with all cancer screenings including dermatology, mammogram, pap smears and colonoscopy. This can be obtained or arrange through the Primary provider.   CDC guidelines for people under immunosuppression: BodyPens.ca     **Continuation of pain, numbness and tingling of lower extremities and night time muscle spasms:  - Continue Amitriptyline to 50 mg if needed at bedtime or can take flexeril 5mg  but not both at the same time. Refill flexeril.    **Fatigue:   -Continue Provigil 100mg  BID. Amantaidne had no effect.    -Follow up 6 months.    INTERVAL HISTORY / CHIEF COMPLAINT :  Patient reports no new neurological symptoms and no MS relapses / sustained progression since the last visit .    IMMUNIZATIONS / MAB / COVID infections:  Moderna COVID vaccine #1 on 03/05/2021 and #2 on 04/02/2021.    VIDEO EXAM:  General :   In no acute distress. Normal skin color.     Alert and oriented to person, place, time and  situation.   Recent and remote memory intact.    Attention span and concentration normal.    Language and spontaneous speech normal, no dysarthria or aphasia.  Fund of knowledge normal.     Neurological Examination:     Cranial Nerves:   Il, lll- Reports no changes in vision.  II, IV, VI- extra ocular movements are intact, No ptosis, no nystagmus.  V- sensation of the face intact b/l.Patient performed on self.  VII- face symmetrical, no facial droop, normal facial movements with smile/grimace  VIII- Hearing grossly intact.  XI- Full shoulder shrug bilaterally  XII- Tongue protrudes midline, full range of movements of the tongue    Neck flexion normal.    Sensory UEs LEs    R L R L   Light touch performed by patient WNL WNL WNL Decreased on thigh, laterally.     Cerebellar/Coordination:  Rapid alternating movements, finger-to-nose and heel-to-shin  bilaterally demonstrates no abnormalities.  No limb, trunk or gait ataxia seen..  No tremors noted.    Motor Exam  Able to stand up from the chair.   Able to march in place.    Gait: normal stride, normal base, normal armswing. Able to tandem, heel, and toe gait without difficulty. Negative Romberg.      PRIOR HISTORY: A 20 y.o.caucasian female.  Patient presented to [facility] for parasthesias and weakness with notable multiple enhancing and non enhancing lesions throughout brain and spinal cord consistent with MS. Lesions not typical for ADEM and she had no prior viral illness or immunization before episode. Her lumbar puncture was also consistent with demyelinating disease with no evidence of infection and elevated IGG index with 4+ oligoclonal bands. Diagnosed with RRMS by Dr. Alberteen Spindle 09/2014 and started on Gilenya.    Hep C AB positive, Hep C RNA, quantitative, ??PCR undetected. LFT's wnl. ??   Phone consult with Hepatology Dr Woodfin Ganja. repeated Hep C AB positve. Most likely false positive.  There is a 5% chance of vertical transmission.  If this were to be the case most children clear the virus spontaneously by age three. Recommendation would still be the same as above.    Discontinued amantadine due to lack of effect. Doing well on Provigil.  Reports muscle spasms in her right calf that occurs at night about three times a week and lasts for several hours to all night.  Lower extremity pain, numbness and tingling that is off and on daily. Currently taking Amitriptyline at bedtime.    LUMBAR PUNCTURE:  07/21/2014 Four or more Oligoclonal bands and IgG index = 0.8.    SCREENING LABS:   07/21/2014 : ACE 20, ANA neg, RF <6.3, , B12 420 , NMO APQ4 IgG neg and Vit D 25-OH 20.  04/17/2017  RPR,  Lyme serology and   MOG IgG1 are all negative.  03/12/2018 JCV positive, index = 3.51.    07/17/2017 Baseline:  T25FW= 4.98. seconds.  MMSE = 26.  PHQ9 = 8.    MS FLARE-UP HISTORY:  Received IVMP while in hospital at time of diagnoses.  04/24/2016 Left arm and left leg weakness, left-sided facial and body sensory disturbance with perception of pain, sleep disturbances in the setting of URI. Received 5 days of IVMP with 100% resolution.  04/26/2018  Left side weakness and  numbness and tingling from head to toe Seen at [facility] ED and received IV Steroids x one. Sent home with oral Prednisone for 4 days. !00% resolved.  Clinical flare-up from 05/01/2018  100% resolved. Symptoms were Numbness or tingling. Received one day of IV steroids followed by four days of oral.  11/16/2018 Received 3 days of oral steroids 1250mg  for eye symptoms. Eye symptoms were unchanged with the steroids.    MS MEDICATION:  Gilenya started 12/13/2014 -08/2016.  Flare up 03/2016 that required 5 days of IVMP with 100% resolution. New ring enahnacing lesion seen on brain MRI and new cervical spine non enhancing lesion.  JCV = positive with index= 3.51.  Tecfidera 08/2016 - 03/2018.  induction of Ocrevus 300mg  on  04/23/2018 (partial) and 300mg  on  05/07/2018 (full) and  600mg  11/09/2018 ( partial). Thickness/knot/ irratation in throat, stomach burning. Discontinued 11/09/2018.  Oral prednisone monthly starting 11/2018.    GYN HISTORY HISTORY:  Menses started at age 32.    FAMILY HISTORY:  No MS. Mother under going work up for Lupus.  Great grandfather and grandmother with IDDM.    REVIEW OF SYSTEMS:  A 10-systems review was performed and, unless otherwise noted, declared negative by patient.    No visits with results within 1 Month(s) from this visit.   Latest known visit with results is:   Appointment on 08/08/2020   Component Date Value Ref Range Status   ??? Albumin 08/08/2020 3.5  3.4 - 5.0 g/dL Final   ??? Total Protein 08/08/2020 6.7  5.7 - 8.2 g/dL Final   ??? Total Bilirubin 08/08/2020 0.2 (L)  0.3 - 1.2 mg/dL Final   ??? Bilirubin, Direct 08/08/2020 <0.10  0.00 - 0.30 mg/dL Final   ??? AST 24/40/1027 15  <=34 U/L Final   ??? ALT 08/08/2020 18  10 - 49 U/L Final   ??? Alkaline Phosphatase 08/08/2020 148 (H)  46 - 116 U/L Final   ??? Vitamin D Total (25OH) 08/08/2020 25.9  20.0 - 80.0 ng/mL Final   ??? WBC 08/08/2020 10.2  4.2 - 10.2 10*9/L Final   ??? RBC 08/08/2020 4.47  3.95 - 5.13 10*12/L Final   ??? HGB 08/08/2020 13.2  11.3 - 14.9 g/dL Final   ??? HCT 25/36/6440 37.6  34.0 - 44.0 % Final   ??? MCV 08/08/2020 84.2  77.6 - 95.7 fL Final   ??? MCH 08/08/2020 29.4  25.9 - 32.4 pg Final   ??? MCHC 08/08/2020 34.9  32.3 - 35.0 g/dL Final   ??? RDW 34/74/2595 13.2  12.2 - 15.2 % Final   ??? MPV 08/08/2020 9.3  7.3 - 10.7 fL Final   ??? Platelet 08/08/2020 326  170 - 380 10*9/L Final   ??? Neutrophils % 08/08/2020 70.8  % Final   ??? Lymphocytes % 08/08/2020 21.3  % Final   ??? Monocytes % 08/08/2020 5.7  % Final   ??? Eosinophils % 08/08/2020 1.3  % Final   ??? Basophils % 08/08/2020 0.9  % Final   ??? Absolute Neutrophils 08/08/2020 7.2 (H)  1.5 - 6.4 10*9/L Final   ??? Absolute Lymphocytes 08/08/2020 2.2  1.1 - 3.6 10*9/L Final   ??? Absolute Monocytes 08/08/2020 0.6  0.3 - 0.8 10*9/L Final   ??? Absolute Eosinophils 08/08/2020 0.1  0.0 - 0.5 10*9/L Final   ??? Absolute Basophils 08/08/2020 0.1  0.0 - 0.1 10*9/L Final       No visits with results within 1 Month(s) from this visit.   Latest known visit with results is:   Appointment on 08/08/2020   Component Date Value Ref Range Status   ??? Albumin 08/08/2020 3.5  3.4 - 5.0 g/dL Final   ???  Total Protein 08/08/2020 6.7  5.7 - 8.2 g/dL Final   ??? Total Bilirubin 08/08/2020 0.2 (L)  0.3 - 1.2 mg/dL Final   ??? Bilirubin, Direct 08/08/2020 <0.10  0.00 - 0.30 mg/dL Final   ??? AST 40/98/1191 15 <=34 U/L Final   ??? ALT 08/08/2020 18  10 - 49 U/L Final   ??? Alkaline Phosphatase 08/08/2020 148 (H)  46 - 116 U/L Final   ??? Vitamin D Total (25OH) 08/08/2020 25.9  20.0 - 80.0 ng/mL Final   ??? WBC 08/08/2020 10.2  4.2 - 10.2 10*9/L Final   ??? RBC 08/08/2020 4.47  3.95 - 5.13 10*12/L Final   ??? HGB 08/08/2020 13.2  11.3 - 14.9 g/dL Final   ??? HCT 47/82/9562 37.6  34.0 - 44.0 % Final   ??? MCV 08/08/2020 84.2  77.6 - 95.7 fL Final   ??? MCH 08/08/2020 29.4  25.9 - 32.4 pg Final   ??? MCHC 08/08/2020 34.9  32.3 - 35.0 g/dL Final   ??? RDW 13/11/6576 13.2  12.2 - 15.2 % Final   ??? MPV 08/08/2020 9.3  7.3 - 10.7 fL Final   ??? Platelet 08/08/2020 326  170 - 380 10*9/L Final   ??? Neutrophils % 08/08/2020 70.8  % Final   ??? Lymphocytes % 08/08/2020 21.3  % Final   ??? Monocytes % 08/08/2020 5.7  % Final   ??? Eosinophils % 08/08/2020 1.3  % Final   ??? Basophils % 08/08/2020 0.9  % Final   ??? Absolute Neutrophils 08/08/2020 7.2 (H)  1.5 - 6.4 10*9/L Final   ??? Absolute Lymphocytes 08/08/2020 2.2  1.1 - 3.6 10*9/L Final   ??? Absolute Monocytes 08/08/2020 0.6  0.3 - 0.8 10*9/L Final   ??? Absolute Eosinophils 08/08/2020 0.1  0.0 - 0.5 10*9/L Final   ??? Absolute Basophils 08/08/2020 0.1  0.0 - 0.1 10*9/L Final       PROBLEM LIST:    Patient Active Problem List   Diagnosis   ??? Fatigue   ??? Rash   ??? Obesity   ??? Multiple sclerosis (CMS-HCC)   ??? Multiple sclerosis exacerbation (CMS-HCC)   ??? Neuropathic pain   ??? Chronic bilateral low back pain without sciatica   ??? Eye pain, bilateral   ??? Type 2 diabetes mellitus without complication (CMS-HCC)   ??? Immunosuppression due to drug therapy (CMS-HCC)   ??? BMI 50.0-59.9, adult (CMS-HCC)     Current Outpatient Medications   Medication Sig Dispense Refill   ??? acetaminophen (TYLENOL ORAL) Take by mouth.     ??? amitriptyline (ELAVIL) 25 MG tablet TAKE 3 TABLETS BY MOUTH AT BEDTIME 90 tablet 5   ??? buPROPion (WELLBUTRIN XL) 300 MG 24 hr tablet TAKE 1 TABLET BY MOUTH EVERY DAY IN THE MORNING 30 tablet 5   ??? cholecalciferol, vitamin D3, 100 mcg (4,000 unit) cap Take by mouth.     ??? cyclobenzaprine (FLEXERIL) 5 MG tablet Take by mouth.     ??? diphenhydramine HCl (BENADRYL ORAL) Take by mouth.     ??? modafiniL (PROVIGIL) 200 MG tablet TAKE 0.5 TABLETS (100 MG TOTAL) BY MOUTH TWO (2) TIMES A DAY. 60 tablet 0   ??? naproxen (NAPROSYN) 500 MG tablet TAKE 1 TABLET BY MOUTH TWICE A DAY WITH MEALS     ??? ofatumumab (KESIMPTA PEN) 20 mg/0.4 mL PnIj Inject contents of 1 syringe (20 mg) under the skin every 28 days. 1.2 mL 1   ??? pantoprazole (PROTONIX) 20 MG tablet TAKE 1  TABLET BY MOUTH TWO TIMES A DAY. 180 tablet 1   ??? promethazine (PHENERGAN) 12.5 MG tablet TAKE 1 TABLET (12.5 MG TOTAL) BY MOUTH EVERY SIX (6) HOURS AS NEEDED FOR NAUSEA. 30 tablet 2   ??? traZODone (DESYREL) 100 MG tablet Take 100 mg by mouth.       No current facility-administered medications for this visit.         Past Surgical Hx:    Past Surgical History:   Procedure Laterality Date   ??? NO PAST SURGERIES         Social Hx:    Social History     Socioeconomic History   ??? Marital status: Single   Tobacco Use   ??? Smoking status: Never   ??? Smokeless tobacco: Never   Substance and Sexual Activity   ??? Alcohol use: No     Alcohol/week: 0.0 standard drinks   ??? Drug use: No   ??? Sexual activity: Never   Social History Narrative    Patient lives at home with Mom, stepfather, and 41 yo brother. She is in 8th grade and is currently not doing well in school. She denies bullying or other psychosocial stressors. She fells safe at home. She denies tobacco, alcohol, and drug use. She has never been sexually active. She is on OCPs to regular her periods. LMP was 2/28.        Family Hx:    Family History   Problem Relation Age of Onset   ??? Diabetes type II Mother    ??? Asthma Mother    ??? Diabetes Mother    ??? Hypertension Mother    ??? No Known Problems Father    ??? ADD / ADHD Brother    ??? Cancer Maternal Grandmother    ??? Glaucoma Neg Hx    ??? Macular degeneration Neg Hx    ??? Retinal detachment Neg Hx    ??? Strabismus Neg Hx      ALLERGIES:    Allergies   Allergen Reactions   ??? Ocrelizumab Anaphylaxis

## 2021-04-18 NOTE — Unmapped (Signed)
Below is your visit summary:    **Continue Kesimpta.  **Have labs drawn with in the next two weeks at Primary Providers office (PCP)..  **Complete your MRI's 05/04/2021.  **Follow up 6 months. Face to face **Interim visit: he clinic.    Navigating the Challenges of MS: MS Navigator??   National MS Society: MS Navigator at [phone]  Finding answers and making decisions relies on having the right information at the right time. MS Navigator are highly skilled, compassionate professionals available Monday through Friday, 9:00 a.m. to 7:00 p.m. ET to connect you to the information, resources and support needed to move your life forward. These supportive partners help navigate the challenges of MS unique to your situation.    Dennisse Swader Jannett Celestine PA-C, MPAS  Division of Multiple Sclerosis    **NEW ADDRESS [facility] at [facility]  [address]  [place], [place] [address]    Phone [phone]  Fax [phone]    [facility] is a specialty clinic and there is a need for you to have a  primary care provider  who will take care of your non-neurological health.   Please set this up if you have not already done so.      In case of:  a suspected relapse (new symptoms or worsening existing symptoms, lasting for >24h)  OR  a need for an additional appointment for other reasons  OR   if you have other questions: please contact:       [facility] Neurology [facility] Desk   Phone: [phone]      Supportive Therapy, Patriciaann Clan, MSW, LCSW.        Phone [phone]    If you have questions for our  pharmacist, please call:      Worthy Flank, PharmD, CPP  Phone: [phone]      If you need financial assistance, please call:      Financial Assistance Unit      Phone: [phone]

## 2021-04-29 MED ORDER — PROMETHAZINE 12.5 MG TABLET
ORAL_TABLET | Freq: Four times a day (QID) | ORAL | 2 refills | 0 days | PRN
Start: 2021-04-29 — End: ?

## 2021-05-01 MED ORDER — PROMETHAZINE 12.5 MG TABLET
ORAL_TABLET | Freq: Four times a day (QID) | ORAL | 2 refills | 8 days | Status: CP | PRN
Start: 2021-05-01 — End: ?

## 2021-05-01 NOTE — Unmapped (Signed)
Last Visit Date: 04/18/2021  Next Visit Date:Recall set 07/17/2021  Last seen by [facility]  Lab Results   Component Value Date    JC Virus AB POSITIVE 08/13/2016    Hep B Surface Ag Nonreactive 03/19/2019    Hep B S Ab Nonreactive 03/12/2018    Hep B Surf Ab Quant <8.00 03/12/2018    Hep B Core Total Ab Nonreactive 03/19/2019    Hepatitis C Ab Reactive (A) 07/06/2018    HIV Antigen/Antibody Combo Nonreactive 03/12/2018    HIV Antigen/Antibody Combo Nonreactive 07/21/2014        No results found for this or any previous visit.      No results found for this or any previous visit.      No results found for this or any previous visit.

## 2021-05-18 DIAGNOSIS — G35 Multiple sclerosis: Principal | ICD-10-CM

## 2021-05-18 MED ORDER — KESIMPTA PEN 20 MG/0.4 ML SUBCUTANEOUS PEN INJECTOR
1 refills | 0 days
Start: 2021-05-18 — End: ?

## 2021-05-18 NOTE — Unmapped (Signed)
Last Visit Date: 04/18/2021  Next Visit Date: Visit date not found    Last seen by [facility]  Lab Results   Component Value Date    JC Virus AB POSITIVE 08/13/2016    Hep B Surface Ag Nonreactive 03/19/2019    Hep B S Ab Nonreactive 03/12/2018    Hep B Surf Ab Quant <8.00 03/12/2018    Hep B Core Total Ab Nonreactive 03/19/2019    Hepatitis C Ab Reactive (A) 07/06/2018    HIV Antigen/Antibody Combo Nonreactive 03/12/2018    HIV Antigen/Antibody Combo Nonreactive 07/21/2014        No results found for this or any previous visit.      No results found for this or any previous visit.      No results found for this or any previous visit.

## 2021-05-22 DIAGNOSIS — G35 Multiple sclerosis: Principal | ICD-10-CM

## 2021-05-22 MED ORDER — KESIMPTA PEN 20 MG/0.4 ML SUBCUTANEOUS PEN INJECTOR
SUBCUTANEOUS | 0 refills | 28.00000 days | Status: CP
Start: 2021-05-22 — End: ?
  Filled 2021-05-24: qty 0.4, 28d supply, fill #0

## 2021-05-22 MED ORDER — BUPROPION HCL XL 300 MG 24 HR TABLET, EXTENDED RELEASE
ORAL_TABLET | 5 refills | 0 days
Start: 2021-05-22 — End: ?

## 2021-05-22 NOTE — Unmapped (Signed)
Neurology update:  Waiting for lab results from PCP.  Refill Kesimpta for one month .

## 2021-05-22 NOTE — Unmapped (Signed)
[facility] Clinical Assessment & Refill Coordination Note    Donna Mata, DOB: [birth year]  Phone: [phone] (home)     All above HIPAA information was verified with patient's family member, mother.     Was a Nurse, learning disability used for this call? No    Specialty Medication(s):   Neurology: Kesimpta     Current Outpatient Medications   Medication Sig Dispense Refill   ??? acetaminophen (TYLENOL ORAL) Take by mouth.     ??? amitriptyline (ELAVIL) 25 MG tablet TAKE 3 TABLETS BY MOUTH AT BEDTIME 90 tablet 5   ??? buPROPion (WELLBUTRIN XL) 300 MG 24 hr tablet TAKE 1 TABLET BY MOUTH EVERY DAY IN THE MORNING 30 tablet 5   ??? cholecalciferol, vitamin D3, 100 mcg (4,000 unit) cap Take by mouth.     ??? cyclobenzaprine (FLEXERIL) 5 MG tablet Take 1 tablet (5 mg total) by mouth nightly as needed for muscle spasms. 30 tablet 1   ??? diphenhydramine HCl (BENADRYL ORAL) Take by mouth.     ??? modafiniL (PROVIGIL) 200 MG tablet TAKE 0.5 TABLETS (100 MG TOTAL) BY MOUTH TWO (2) TIMES A DAY. 60 tablet 0   ??? naproxen (NAPROSYN) 500 MG tablet TAKE 1 TABLET BY MOUTH TWICE A DAY WITH MEALS     ??? ofatumumab (KESIMPTA PEN) 20 mg/0.4 mL PnIj Inject contents of 1 syringe (20 mg) under the skin every 28 days. 1.2 mL 1   ??? pantoprazole (PROTONIX) 20 MG tablet TAKE 1 TABLET BY MOUTH TWO TIMES A DAY. 180 tablet 1   ??? promethazine (PHENERGAN) 12.5 MG tablet TAKE 1 TABLET (12.5 MG TOTAL) BY MOUTH EVERY SIX (6) HOURS AS NEEDED FOR NAUSEA. 30 tablet 2   ??? traZODone (DESYREL) 100 MG tablet Take 100 mg by mouth.       No current facility-administered medications for this visit.        Changes to medications: Donna Mata reports no changes at this time.    Allergies   Allergen Reactions   ??? Ocrelizumab Anaphylaxis       Changes to allergies: No    SPECIALTY MEDICATION ADHERENCE     Kesimpta 20 mg/0.41ml: 0 days of medicine on hand       Medication Adherence    Patient reported X missed doses in the last month: 0  Specialty Medication: Kesimpta   Other adherence tool: It is part of patient's routine.           Specialty medication(s) dose(s) confirmed: Regimen is correct and unchanged.     Are there any concerns with adherence? No    Adherence counseling provided? Not needed    CLINICAL MANAGEMENT AND INTERVENTION      Clinical Benefit Assessment:    Do you feel the medicine is effective or helping your condition? Yes    Clinical Benefit counseling provided? Not needed    Adverse Effects Assessment:    Are you experiencing any side effects? No    Are you experiencing difficulty administering your medicine? No    Quality of Life Assessment:    Quality of Life    Rheumatology  Oncology  Dermatology  Cystic Fibrosis          How many days over the past month did your MS  keep you from your normal activities? For example, brushing your teeth or getting up in the morning. 50%    Have you discussed this with your provider? Yes    Acute Infection Status:  Acute infections noted within Epic:  No active infections  Patient reported infection: None    Therapy Appropriateness:    Is therapy appropriate and patient progressing towards therapeutic goals? Yes, therapy is appropriate and should be continued    DISEASE/MEDICATION-SPECIFIC INFORMATION      For patients on injectable medications: Patient currently has 0 doses left.  Next injection is scheduled for around 2/6.    PATIENT SPECIFIC NEEDS     - Does the patient have any physical, cognitive, or cultural barriers? No    - Is the patient high risk? No    - Does the patient require a Care Management Plan? No     SOCIAL DETERMINANTS OF HEALTH     At the [facility], we have learned that life circumstances - like trouble affording food, housing, utilities, or transportation can affect the health of many of our patients.   That is why we wanted to ask: are you currently experiencing any life circumstances that are negatively impacting your health and/or quality of life? No    Social Determinants of Health     Food Insecurity: Not on file   Tobacco Use: Low Risk    ??? Smoking Tobacco Use: Never   ??? Smokeless Tobacco Use: Never   ??? Passive Exposure: Not on file   Transportation Needs: Not on file   Alcohol Use: Not on file   Housing/Utilities: Unknown   ??? Within the past 12 months, have you ever stayed: outside, in a car, in a tent, in an overnight shelter, or temporarily in someone else's home (i.e. couch-surfing)?: No   ??? Are you worried about losing your housing?: Not on file   ??? Within the past 12 months, have you been unable to get utilities (heat, electricity) when it was really needed?: Not on file   Substance Use: Not on file   Financial Resource Strain: Not on file   Physical Activity: Not on file   Health Literacy: Not on file   Stress: Not on file   Intimate Partner Violence: Not on file   Depression: Not on file   Social Connections: Not on file       Would you be willing to receive help with any of the needs that you have identified today? Not applicable       SHIPPING     Specialty Medication(s) to be Shipped:   Neurology: Kesimpta    Other medication(s) to be shipped: No additional medications requested for fill at this time     Changes to insurance: No    Delivery Scheduled: Yes, Expected medication delivery date: 1/27.  However, Rx request for refills was sent to the provider as there are none remaining.     Medication will be delivered via UPS to the confirmed prescription address in [facility].    The patient will receive a drug information handout for each medication shipped and additional FDA Medication Guides as required.  Verified that patient has previously received a Conservation officer, historic buildings and a Surveyor, mining.    The patient or caregiver noted above participated in the development of this care plan and knows that they can request review of or adjustments to the care plan at any time.      All of the patient's questions and concerns have been addressed.    Clydell Hakim   [facility] Specialty Pharmacist

## 2021-05-23 NOTE — Unmapped (Signed)
Last Visit Date: 04/18/2021  Next Visit Date:Recall set 07/17/2021  Last seen by [facility]  Lab Results   Component Value Date    JC Virus AB POSITIVE 08/13/2016    Hep B Surface Ag Nonreactive 03/19/2019    Hep B S Ab Nonreactive 03/12/2018    Hep B Surf Ab Quant <8.00 03/12/2018    Hep B Core Total Ab Nonreactive 03/19/2019    Hepatitis C Ab Reactive (A) 07/06/2018    HIV Antigen/Antibody Combo Nonreactive 03/12/2018    HIV Antigen/Antibody Combo Nonreactive 07/21/2014        No results found for this or any previous visit.      No results found for this or any previous visit.      No results found for this or any previous visit.

## 2021-05-24 MED ORDER — BUPROPION HCL XL 300 MG 24 HR TABLET, EXTENDED RELEASE
ORAL_TABLET | 5 refills | 0 days | Status: CP
Start: 2021-05-24 — End: ?

## 2021-05-26 MED ORDER — MODAFINIL 200 MG TABLET
ORAL_TABLET | Freq: Two times a day (BID) | ORAL | 0 refills | 60.00000 days
Start: 2021-05-26 — End: ?

## 2021-05-27 MED ORDER — MODAFINIL 200 MG TABLET
ORAL_TABLET | Freq: Two times a day (BID) | ORAL | 0 refills | 60.00000 days
Start: 2021-05-27 — End: ?

## 2021-05-28 MED ORDER — MODAFINIL 200 MG TABLET
ORAL_TABLET | Freq: Two times a day (BID) | ORAL | 0 refills | 60.00000 days | Status: CP
Start: 2021-05-28 — End: ?

## 2021-05-28 NOTE — Unmapped (Signed)
Last Visit Date: 04/18/2021  Next Visit Date: Recall set foe 07/17/2021  Last seen by [facility]  Lab Results   Component Value Date    JC Virus AB POSITIVE 08/13/2016    Hep B Surface Ag Nonreactive 03/19/2019    Hep B S Ab Nonreactive 03/12/2018    Hep B Surf Ab Quant <8.00 03/12/2018    Hep B Core Total Ab Nonreactive 03/19/2019    Hepatitis C Ab Reactive (A) 07/06/2018    HIV Antigen/Antibody Combo Nonreactive 03/12/2018    HIV Antigen/Antibody Combo Nonreactive 07/21/2014        No results found for this or any previous visit.      No results found for this or any previous visit.      No results found for this or any previous visit.

## 2021-05-28 NOTE — Unmapped (Signed)
Last Visit Date: 05/21/2021  Next Visit Date:Recalls 07/17/2021  Last seen by [facility]  Lab Results   Component Value Date    JC Virus AB POSITIVE 08/13/2016    Hep B Surface Ag Nonreactive 03/19/2019    Hep B S Ab Nonreactive 03/12/2018    Hep B Surf Ab Quant <8.00 03/12/2018    Hep B Core Total Ab Nonreactive 03/19/2019    Hepatitis C Ab Reactive (A) 07/06/2018    HIV Antigen/Antibody Combo Nonreactive 03/12/2018    HIV Antigen/Antibody Combo Nonreactive 07/21/2014        No results found for this or any previous visit.      No results found for this or any previous visit.      No results found for this or any previous visit.

## 2021-06-05 ENCOUNTER — Ambulatory Visit: Admit: 2021-06-05 | Discharge: 2021-06-05 | Payer: PRIVATE HEALTH INSURANCE

## 2021-06-05 MED ADMIN — gadoterate meglumine (DOTAREM) Soln 20 mL: 20 mL | INTRAVENOUS | @ 03:00:00 | Stop: 2021-06-04

## 2021-06-21 MED ORDER — PROMETHAZINE 12.5 MG TABLET
ORAL_TABLET | Freq: Four times a day (QID) | ORAL | 2 refills | 0 days | PRN
Start: 2021-06-21 — End: ?

## 2021-06-21 NOTE — Unmapped (Signed)
Last Visit Date:04/18/2022  Next Visit Date:Recall sent out 07/17/2021  Last seen by [facility]  Lab Results   Component Value Date    JC Virus AB POSITIVE 08/13/2016    Hep B Surface Ag Nonreactive 03/19/2019    Hep B S Ab Nonreactive 03/12/2018    Hep B Surf Ab Quant <8.00 03/12/2018    Hep B Core Total Ab Nonreactive 03/19/2019    Hepatitis C Ab Reactive (A) 07/06/2018    HIV Antigen/Antibody Combo Nonreactive 03/12/2018    HIV Antigen/Antibody Combo Nonreactive 07/21/2014        No results found for this or any previous visit.      No results found for this or any previous visit.      No results found for this or any previous visit.

## 2021-06-22 DIAGNOSIS — G35 Multiple sclerosis: Principal | ICD-10-CM

## 2021-06-22 MED ORDER — PROMETHAZINE 12.5 MG TABLET
ORAL_TABLET | Freq: Four times a day (QID) | ORAL | 2 refills | 8.00000 days | PRN
Start: 2021-06-22 — End: ?

## 2021-06-22 MED ORDER — KESIMPTA PEN 20 MG/0.4 ML SUBCUTANEOUS PEN INJECTOR
SUBCUTANEOUS | 0 refills | 28 days | Status: CP
Start: 2021-06-22 — End: ?
  Filled 2021-06-27: qty 0.4, 28d supply, fill #0

## 2021-06-22 NOTE — Unmapped (Signed)
[facility] Refill Coordination Note    Specialty Medication(s) to be Shipped:   Neurology: Kesimpta    Other medication(s) to be shipped: No additional medications requested for fill at this time     Donna Mata, DOB: [birth year]  Phone: [phone] (home)       All above HIPAA information was verified with patient's family member, mom.     Was a Nurse, learning disability used for this call? No    Completed refill call assessment today to schedule patient's medication shipment from the [facility] [phone]).  All relevant notes have been reviewed.     Specialty medication(s) and dose(s) confirmed: Regimen is correct and unchanged.   Changes to medications: Donna Mata reports no changes at this time.  Changes to insurance: No  New side effects reported not previously addressed with a pharmacist or physician: None reported  Questions for the pharmacist: No    Confirmed patient received a Conservation officer, historic buildings and a Surveyor, mining with first shipment. The patient will receive a drug information handout for each medication shipped and additional FDA Medication Guides as required.       DISEASE/MEDICATION-SPECIFIC INFORMATION        For patients on injectable medications: Patient currently has 0 doses left.  Next injection is scheduled for 3/6.    SPECIALTY MEDICATION ADHERENCE     Medication Adherence    Patient reported X missed doses in the last month: 0  Specialty Medication: Kesimpta  Patient is on additional specialty medications: No  Patient is on more than two specialty medications: No  Any gaps in refill history greater than 2 weeks in the last 3 months: no  Demonstrates understanding of importance of adherence: yes  Informant: mother   Other adherence tool: It is part of patient's routine.               Were doses missed due to medication being on hold? No    Kesimpta 20mg /0.53ml: Patient has 0 days of medication on hand    REFERRAL TO PHARMACIST     Referral to the pharmacist: Not needed      [facility]     Shipping address confirmed in Epic.     Delivery Scheduled: Yes, Expected medication delivery date: 3/2.  However, Rx request for refills was sent to the provider as there are none remaining.     Medication will be delivered via UPS to the prescription address in Epic WAM.    [name]   [facility] Specialty Technician

## 2021-06-22 NOTE — Unmapped (Signed)
Request received via interface.     Provider: [name]    Last Visit Date: 04/18/2021    Next Visit Date: Visit date not found, recall 07/17/2021    Lab Results   Component Value Date    JC Virus AB POSITIVE 08/13/2016    Hep B Surface Ag Nonreactive 03/19/2019    Hep B S Ab Nonreactive 03/12/2018    Hep B Surf Ab Quant <8.00 03/12/2018    Hep B Core Total Ab Nonreactive 03/19/2019    Hepatitis C Ab Reactive (A) 07/06/2018    HIV Antigen/Antibody Combo Nonreactive 03/12/2018    HIV Antigen/Antibody Combo Nonreactive 07/21/2014        No results found for this or any previous visit.      No results found for this or any previous visit.      No results found for this or any previous visit.

## 2021-06-22 NOTE — Unmapped (Signed)
Neurology update:  Second reminder send  to go to PCP to draw labs.  Kesimpta refill for one month.

## 2021-06-22 NOTE — Unmapped (Signed)
Called patient went to voicemail , sent lab orders through MyChart as well as mailed orders.

## 2021-07-23 DIAGNOSIS — G35 Multiple sclerosis: Principal | ICD-10-CM

## 2021-07-23 MED ORDER — KESIMPTA PEN 20 MG/0.4 ML SUBCUTANEOUS PEN INJECTOR
SUBCUTANEOUS | 0 refills | 28 days
Start: 2021-07-23 — End: ?

## 2021-07-23 NOTE — Unmapped (Addendum)
The [facility] has made a second and final attempt to reach this patient to refill the following medication:KESIMPTA PEN 20 mg/0.4 mL     We have been unable to leave messages on the following phone numbers: [phone] and have sent a MyChart message.    Dates contacted: 07/18/21  And   07/23/21  Last scheduled delivery:  06/27/21   The patient may be at risk of non-compliance with this medication. The patient should call the [facility] at [phone]  Option 4, then Option 2 (all other specialty patients) to refill medication.    Ricci Barker   [facility] Specialty Technician

## 2021-07-24 MED ORDER — PANTOPRAZOLE 20 MG TABLET,DELAYED RELEASE
ORAL_TABLET | Freq: Two times a day (BID) | ORAL | 1 refills | 0.00000 days | Status: CP
Start: 2021-07-24 — End: 2021-07-24

## 2021-07-24 MED ORDER — KESIMPTA PEN 20 MG/0.4 ML SUBCUTANEOUS PEN INJECTOR
SUBCUTANEOUS | 0 refills | 28 days | Status: CP
Start: 2021-07-24 — End: ?
  Filled 2021-08-21: qty 0.4, 28d supply, fill #0

## 2021-07-24 NOTE — Unmapped (Signed)
Neurology update:  Waiting on lab results from PCP.  Refill Kesimpta one month only.

## 2021-07-24 NOTE — Unmapped (Incomplete Revision)
Last Visit Date: 04/09/2021  Next Visit Date: No future appointment scheduled  Last seen by [facility]  Lab Results   Component Value Date    JC Virus AB POSITIVE 08/13/2016    Hep B Surface Ag Nonreactive 03/19/2019    Hep B S Ab Nonreactive 03/12/2018    Hep B Surf Ab Quant <8.00 03/12/2018    Hep B Core Total Ab Nonreactive 03/19/2019    Hepatitis C Ab Reactive (A) 07/06/2018    HIV Antigen/Antibody Combo Nonreactive 03/12/2018    HIV Antigen/Antibody Combo Nonreactive 07/21/2014        No results found for this or any previous visit.      No results found for this or any previous visit.      No results found for this or any previous visit.

## 2021-07-24 NOTE — Unmapped (Signed)
Last Visit Date: 04/09/2021  Next Visit Date: Visit date not found    Lab Results   Component Value Date    JC Virus AB POSITIVE 08/13/2016    Hep B Surface Ag Nonreactive 03/19/2019    Hep B S Ab Nonreactive 03/12/2018    Hep B Surf Ab Quant <8.00 03/12/2018    Hep B Core Total Ab Nonreactive 03/19/2019    Hepatitis C Ab Reactive (A) 07/06/2018    HIV Antigen/Antibody Combo Nonreactive 03/12/2018    HIV Antigen/Antibody Combo Nonreactive 07/21/2014        No results found for this or any previous visit.      No results found for this or any previous visit.      No results found for this or any previous visit.

## 2021-07-24 NOTE — Unmapped (Signed)
Last Visit Date: 04/18/2021  Next Visit Date: No future appointment scheduled  Last seen by [facility]    Lab Results   Component Value Date    JC Virus AB POSITIVE 08/13/2016    Hep B Surface Ag Nonreactive 03/19/2019    Hep B S Ab Nonreactive 03/12/2018    Hep B Surf Ab Quant <8.00 03/12/2018    Hep B Core Total Ab Nonreactive 03/19/2019    Hepatitis C Ab Reactive (A) 07/06/2018    HIV Antigen/Antibody Combo Nonreactive 03/12/2018    HIV Antigen/Antibody Combo Nonreactive 07/21/2014        No results found for this or any previous visit.      No results found for this or any previous visit.      No results found for this or any previous visit.

## 2021-07-27 NOTE — Unmapped (Addendum)
Spoke with patient concerning labs. Patient states her PCP would need to order labs before they can be done. Due to her pcp do not do outside labs. Ask if patient still have print out if so I advise patient to take order to [facility]. Patient states she will do soon as she can. Thanks           ----- Message from [facility] [place], [place] sent at 07/24/2021  3:36 PM EDT -----  Phil Dopp, Can you please:  -Obtain lab results from PCP? See in media.    Thank you, [name]

## 2021-07-27 NOTE — Unmapped (Signed)
-----   Message from Raelene Bott, [place] sent at 07/26/2021  4:58 PM EDT -----  Regarding: Lab results  Spoke with patient concerning labs. Patient states her PCP would need to order labs before they can be done. Due to her pcp do not do outside labs. Ask if patient still have print out if so I advise patient to take order to [facility]. Patient states she will do soon as she can. Thanks  ----- Message -----  From: Cy Blamer, PA  Sent: 07/24/2021   3:36 PM EDT  To: Raelene Bott, CMA    Selena, Can you please:  -Obtain lab results from PCP? See in media.    Thank you, Jeremie Giangrande

## 2021-07-30 NOTE — Unmapped (Signed)
Upon investigation, one additional attempt for patient contact is warranted.  The [facility] will reach out to patient on 07/30/21. This attempt was unsuccessful.     Arnold Long  [facility] Specialty Pharmacist

## 2021-08-01 NOTE — Unmapped (Signed)
RN received voicemail from patients PCP wanting clarification on IgG lab order.     RN attempted to call them back, left voicemail with CPT code of 78469 ([facility]) for IgG lab.     Also attempted to call patient, mailbox full (patient also needs to call [facility] for Kesimpta delivery)

## 2021-08-20 NOTE — Unmapped (Signed)
[facility] Refill Coordination Note    Specialty Medication(s) to be Shipped:   Neurology: Kesimpta    Other medication(s) to be shipped: No additional medications requested for fill at this time     Donna Mata, DOB: December 20, 2000  Phone: [phone] (home)       All above HIPAA information was verified with patient.     Was a Nurse, learning disability used for this call? No    Completed refill call assessment today to schedule patient's medication shipment from the [facility] [phone]).  All relevant notes have been reviewed.     Specialty medication(s) and dose(s) confirmed: Regimen is correct and unchanged.   Changes to medications: Donna Mata reports no changes at this time.  Changes to insurance: No  New side effects reported not previously addressed with a pharmacist or physician: None reported  Questions for the pharmacist: No    Confirmed patient received a Conservation officer, historic buildings and a Surveyor, mining with first shipment. The patient will receive a drug information handout for each medication shipped and additional FDA Medication Guides as required.       DISEASE/MEDICATION-SPECIFIC INFORMATION        For patients on injectable medications: Patient currently has 0 doses left.  Next injection is scheduled for 08/25/2021.    SPECIALTY MEDICATION ADHERENCE     Medication Adherence    Patient reported X missed doses in the last month: 0  Specialty Medication: Kesimpta  Patient is on additional specialty medications: No  Any gaps in refill history greater than 2 weeks in the last 3 months: no  Demonstrates understanding of importance of adherence: yes  Informant: patient  Reliability of informant: reliable   Other adherence tool: It is part of patient's routine.   Confirmed plan for next specialty medication refill: delivery by pharmacy  Refills needed for supportive medications: not needed              Were doses missed due to medication being on hold? No    Kesimpta 20mg /0.46ml: Patient has 0 days of medication on hand    REFERRAL TO PHARMACIST     Referral to the pharmacist: Not needed      [facility]     Shipping address confirmed in Epic.     Delivery Scheduled: Yes, Expected medication delivery date: 08/22/2021.     Medication will be delivered via UPS to the prescription address in Epic WAM.    Donna Mata D Tod Abrahamsen   [facility] Specialty Technician

## 2021-09-12 DIAGNOSIS — G35 Multiple sclerosis: Principal | ICD-10-CM

## 2021-09-12 MED ORDER — KESIMPTA PEN 20 MG/0.4 ML SUBCUTANEOUS PEN INJECTOR
SUBCUTANEOUS | 0 refills | 28 days
Start: 2021-09-12 — End: ?

## 2021-09-13 NOTE — Unmapped (Signed)
Last Visit Date: 04/18/2021  Next Visit Date: 10/04/2021    Lab Results   Component Value Date    JC Virus AB POSITIVE 08/13/2016    Hep B Surface Ag Nonreactive 03/19/2019    Hep B S Ab Nonreactive 03/12/2018    Hep B Surf Ab Quant <8.00 03/12/2018    Hep B Core Total Ab Nonreactive 03/19/2019    Hepatitis C Ab Reactive (A) 07/06/2018    HIV Antigen/Antibody Combo Nonreactive 03/12/2018    HIV Antigen/Antibody Combo Nonreactive 07/21/2014        No results found for this or any previous visit.      No results found for this or any previous visit.      No results found for this or any previous visit.

## 2021-09-14 MED ORDER — KESIMPTA PEN 20 MG/0.4 ML SUBCUTANEOUS PEN INJECTOR
SUBCUTANEOUS | 0 refills | 28 days | Status: CP
Start: 2021-09-14 — End: ?
  Filled 2021-09-20: qty 0.4, 28d supply, fill #0

## 2021-09-14 NOTE — Unmapped (Signed)
Neurology update:  Refill Kesimpta for one one.  Looks like labs were done 08/23/2021 at [facility]. Received IgG result but not hte CBC/d. Staff asked to obtain.

## 2021-09-17 MED ORDER — PROVIGIL 200 MG TABLET
ORAL_TABLET | Freq: Two times a day (BID) | ORAL | 5 refills | 30 days
Start: 2021-09-17 — End: ?

## 2021-09-17 NOTE — Unmapped (Signed)
Request for lab result per [facility] from [facility] date 08/22/2021 will be fax over today.Thanks

## 2021-09-18 MED ORDER — PROVIGIL 200 MG TABLET
ORAL_TABLET | Freq: Two times a day (BID) | ORAL | 5 refills | 30 days | Status: CP
Start: 2021-09-18 — End: ?

## 2021-09-18 NOTE — Unmapped (Signed)
[facility] Refill Coordination Note    Declan Adamson [place], [place]: 2001/03/11  Phone: [phone] (home)       All above HIPAA information was verified with patient.     Specialty Rx Medication Refill Questionnaire 09/18/2021   Which Medications would you like refilled and shipped? KESIMPTA PEN 20 mg/0.4 mL Pnij (ofatumumab)   Please list all current allergies: No   Have you missed any doses in the last 30 days? No   Have you had any changes to your medication(s) since your last refill? No   How many days remaining of each medication do you have at home? 0   Have you experienced any side effects in the last 30 days? No   Please enter the full address (street address, city, state, zip code) where you would like your medication(s) to be delivered to. [address], [place], [place] [address]   Please specify on which day you would like your medication(s) to arrive. Note: if you need your medication(s) within 3 days, please call the pharmacy to schedule your order at [phone]  09/21/2021   Has your insurance changed since your last refill? No   Would you like a pharmacist to call you to discuss your medication(s)? No   Do you require a signature for your package? (Note: if we are billing Medicare Part B or your order contains a controlled substance, we will require a signature) No         Completed refill call assessment today to schedule patient's medication shipment from the [facility] [phone]).  All relevant notes have been reviewed.       Confirmed patient received a Conservation officer, historic buildings and a Surveyor, mining with first shipment. The patient will receive a drug information handout for each medication shipped and additional FDA Medication Guides as required.         REFERRAL TO PHARMACIST     Referral to the pharmacist: Not needed      [facility]     Shipping address confirmed in Epic.     Delivery Scheduled: Yes, Expected medication delivery date: 09/21/21 . Medication will be delivered via UPS to the prescription address in Epic WAM.    Ricci Barker   [facility] Specialty Technician

## 2021-09-19 NOTE — Unmapped (Signed)
RN called [facility] at [phone] and was advised [facility] [place]. RN called them at [phone] and they have ordered Rx, should be in today or tomorrow.

## 2021-09-20 MED ORDER — PROVIGIL 200 MG TABLET
ORAL_TABLET | Freq: Two times a day (BID) | ORAL | 5 refills | 30 days | Status: CP
Start: 2021-09-20 — End: ?

## 2021-09-20 NOTE — Unmapped (Signed)
Addended by: Minna Antis on: 09/20/2021 01:13 PM     Modules accepted: Orders

## 2021-09-21 DIAGNOSIS — G35 Multiple sclerosis: Principal | ICD-10-CM

## 2021-10-03 NOTE — Unmapped (Signed)
LVM for pt to call to r/s neurology appt canceled via mychart

## 2021-10-05 MED ORDER — AMITRIPTYLINE 25 MG TABLET
ORAL_TABLET | 5 refills | 0 days
Start: 2021-10-05 — End: ?

## 2021-10-05 NOTE — Unmapped (Signed)
Last Visit Date: 04/18/2021  Next Visit Date: No future appointment scheduled    Lab Results   Component Value Date    JC Virus AB POSITIVE 08/13/2016    Hep B Surface Ag Nonreactive 03/19/2019    Hep B S Ab Nonreactive 03/12/2018    Hep B Surf Ab Quant <8.00 03/12/2018    Hep B Core Total Ab Nonreactive 03/19/2019    Hepatitis C Ab Reactive (A) 07/06/2018    HIV Antigen/Antibody Combo Nonreactive 03/12/2018    HIV Antigen/Antibody Combo Nonreactive 07/21/2014        No results found for this or any previous visit.      No results found for this or any previous visit.      No results found for this or any previous visit.

## 2021-10-07 MED ORDER — AMITRIPTYLINE 25 MG TABLET
ORAL_TABLET | 5 refills | 0 days | Status: CP
Start: 2021-10-07 — End: ?

## 2021-10-12 DIAGNOSIS — G35 Multiple sclerosis: Principal | ICD-10-CM

## 2021-10-12 MED ORDER — KESIMPTA PEN 20 MG/0.4 ML SUBCUTANEOUS PEN INJECTOR
SUBCUTANEOUS | 0 refills | 28.00000 days
Start: 2021-10-12 — End: ?

## 2021-10-12 NOTE — Unmapped (Signed)
Last Visit Date: 04/18/2021  Next Visit Date: No future appointment scheduled  Last seen by [facility]    Lab Results   Component Value Date    JC Virus AB POSITIVE 08/13/2016    Hep B Surface Ag Nonreactive 03/19/2019    Hep B S Ab Nonreactive 03/12/2018    Hep B Surf Ab Quant <8.00 03/12/2018    Hep B Core Total Ab Nonreactive 03/19/2019    Hepatitis C Ab Reactive (A) 07/06/2018    HIV Antigen/Antibody Combo Nonreactive 03/12/2018    HIV Antigen/Antibody Combo Nonreactive 07/21/2014        No results found for this or any previous visit.      No results found for this or any previous visit.      No results found for this or any previous visit.

## 2021-10-15 DIAGNOSIS — G35 Multiple sclerosis: Principal | ICD-10-CM

## 2021-10-15 NOTE — Unmapped (Signed)
Last Visit Date: 04/18/2021  Next Visit Date: Recall sent out 06/19/2021  No future appointment scheduled    Lab Results   Component Value Date    JC Virus AB POSITIVE 08/13/2016    Hep B Surface Ag Nonreactive 03/19/2019    Hep B S Ab Nonreactive 03/12/2018    Hep B Surf Ab Quant <8.00 03/12/2018    Hep B Core Total Ab Nonreactive 03/19/2019    Hepatitis C Ab Reactive (A) 07/06/2018    HIV Antigen/Antibody Combo Nonreactive 03/12/2018    HIV Antigen/Antibody Combo Nonreactive 07/21/2014        No results found for this or any previous visit.      No results found for this or any previous visit.      No results found for this or any previous visit.

## 2021-10-16 MED ORDER — KESIMPTA PEN 20 MG/0.4 ML SUBCUTANEOUS PEN INJECTOR
SUBCUTANEOUS | 0 refills | 28 days | Status: CP
Start: 2021-10-16 — End: ?
  Filled 2021-10-22: qty 0.4, 28d supply, fill #0

## 2021-10-16 NOTE — Unmapped (Signed)
Patient Comment: Check up           Sent patient a well text, because phone was not working correctly

## 2021-10-16 NOTE — Unmapped (Signed)
Neurology update:   Still waiting for CBC/diff results from [facility] from 08/22/2021.  Staff asked to obtain and to schedule a follow up.  Refill Kesimpta for one month.

## 2021-10-17 NOTE — Unmapped (Signed)
RN called [facility] at [phone], patient has not had CBC w diff completed in the last year.

## 2021-10-17 NOTE — Unmapped (Signed)
Call spoke with Harriett Sine at [facility] who states last labs was 2019.    Unable to reach patient @ [phone]

## 2021-10-22 NOTE — Unmapped (Addendum)
Multiple unsuccessful outreaches made to patient. Mychart sent and patient responded stating her phone has been disconnected and she is unable to call us. Provided mychart questionnaire for refill and additional clinical assessment questions which she answered via mychart.        [facility] Clinical Assessment & Refill Coordination Note    Donna Mata, DOB: February 21, 2001  Phone: [phone] (home)     All above HIPAA information was verified with patient.     Was a Nurse, learning disability used for this call? No    Specialty Medication(s):   Neurology: Kesimpta     Current Outpatient Medications   Medication Sig Dispense Refill    acetaminophen (TYLENOL ORAL) Take by mouth.      amitriptyline (ELAVIL) 25 MG tablet TAKE 3 TABLETS BY MOUTH AT BEDTIME 90 tablet 5    buPROPion (WELLBUTRIN XL) 300 MG 24 hr tablet TAKE 1 TABLET BY MOUTH EVERY DAY IN THE MORNING 30 tablet 5    cholecalciferol, vitamin D3, 100 mcg (4,000 unit) cap Take by mouth.      cyclobenzaprine (FLEXERIL) 5 MG tablet Take 1 tablet (5 mg total) by mouth nightly as needed for muscle spasms. 30 tablet 1    diphenhydramine HCl (BENADRYL ORAL) Take by mouth.      naproxen (NAPROSYN) 500 MG tablet TAKE 1 TABLET BY MOUTH TWICE A DAY WITH MEALS      ofatumumab (KESIMPTA PEN) 20 mg/0.4 mL PnIj Inject 20 mg under the skin every twenty-eight (28) days. 0.4 mL 0    pantoprazole (PROTONIX) 20 MG tablet Take 1 tablet (20 mg total) by mouth Two (2) times a day. 180 tablet 1    promethazine (PHENERGAN) 12.5 MG tablet TAKE 1 TABLET (12.5 MG TOTAL) BY MOUTH EVERY SIX (6) HOURS AS NEEDED FOR NAUSEA. 30 tablet 2    PROVIGIL 200 mg tablet Take 0.5 tablets (100 mg total) by mouth two (2) times a day. 30 tablet 5    traZODone (DESYREL) 100 MG tablet Take 100 mg by mouth.       No current facility-administered medications for this visit.        Medication Adherence       Other adherence tool: It is part of patient's routine. 10/18/2021     8:54 PM   Specialty Rx Medication Refill Questionnaire   Which Medications would you like refilled and shipped? Kesimpta   Please list all current allergies: None   Have you missed any doses in the last 30 days? No   Have you had any changes to your medication(s) since your last refill? No   How many days remaining of each medication do you have at home? 0   Have you experienced any side effects in the last 30 days? No   Please enter the full address (street address, city, state, zip code) where you would like your medication(s) to be delivered to. [address] [place] [address]   Please specify on which day you would like your medication(s) to arrive. Note: if you need your medication(s) within 3 days, please call the pharmacy to schedule your order at [phone]  10/22/2021   Has your insurance changed since your last refill? No   Would you like a pharmacist to call you to discuss your medication(s)? No   Do you require a signature for your package? (Note: if we are billing Medicare Part B or your order contains a controlled substance, we will require a signature)  No       CLINICAL MANAGEMENT AND INTERVENTION      Clinical Benefit Assessment:    Do you feel the medicine is effective or helping your condition? Yes    Clinical Benefit counseling provided? Not needed    Adverse Effects Assessment:    Are you experiencing any side effects? No    Are you experiencing difficulty administering your medicine? No    Quality of Life Assessment:    Quality of Life    Rheumatology  Oncology  Dermatology  Cystic Fibrosis          How many days over the past month did your MS  keep you from your normal activities? For example, brushing your teeth or getting up in the morning. 0    Have you discussed this with your provider? Not needed    Acute Infection Status:    Acute infections noted within Epic:  No active infections  Patient reported infection: None    Therapy Appropriateness:    Is therapy appropriate and patient progressing towards therapeutic goals? Yes, therapy is appropriate and should be continued    DISEASE/MEDICATION-SPECIFIC INFORMATION      For patients on injectable medications: Patient currently has 0 doses left.  Next injection is scheduled for 10/24/21.    PATIENT SPECIFIC NEEDS     Does the patient have any physical, cognitive, or cultural barriers? No    Is the patient high risk? No    Does the patient require a Care Management Plan? No     SOCIAL DETERMINANTS OF HEALTH     At the [facility], we have learned that life circumstances - like trouble affording food, housing, utilities, or transportation can affect the health of many of our patients.   That is why we wanted to ask: are you currently experiencing any life circumstances that are negatively impacting your health and/or quality of life? Patient declined to answer    Social Determinants of Health     Financial Resource Strain: Not on file   Internet Connectivity: Not on file   Food Insecurity: Not on file   Tobacco Use: Not on file   Housing/Utilities: Unknown    Within the past 12 months, have you ever stayed: outside, in a car, in a tent, in an overnight shelter, or temporarily in someone else's home (i.e. couch-surfing)?: No    Are you worried about losing your housing?: Not on file    Within the past 12 months, have you been unable to get utilities (heat, electricity) when it was really needed?: Not on file   Alcohol Use: Not on file   Transportation Needs: Not on file   Substance Use: Not on file   Health Literacy: Not on file   Physical Activity: Not on file   Interpersonal Safety: Not on file   Stress: Not on file   Intimate Partner Violence: Not on file   Depression: Not on file   Social Connections: Not on file       Would you be willing to receive help with any of the needs that you have identified today? Not applicable       SHIPPING     Specialty Medication(s) to be Shipped:   Neurology: Kesimpta    Other medication(s) to be shipped: No additional medications requested for fill at this time     Changes to insurance: No    Delivery Scheduled: Yes, Expected medication delivery date: 10/23/21.     Medication  will be delivered via UPS to the confirmed prescription address in [facility].    The patient will receive a drug information handout for each medication shipped and additional FDA Medication Guides as required.  Verified that patient has previously received a Conservation officer, historic buildings and a Surveyor, mining.    The patient or caregiver noted above participated in the development of this care plan and knows that they can request review of or adjustments to the care plan at any time.      All of the patient's questions and concerns have been addressed.    Arnold Long   [facility] Specialty Pharmacist

## 2021-11-07 DIAGNOSIS — G35 Multiple sclerosis: Principal | ICD-10-CM

## 2021-11-07 MED ORDER — KESIMPTA PEN 20 MG/0.4 ML SUBCUTANEOUS PEN INJECTOR
SUBCUTANEOUS | 0 refills | 28 days
Start: 2021-11-07 — End: ?

## 2021-11-08 NOTE — Unmapped (Signed)
Last Visit Date: 04/18/2021  Next Visit Date: No future appointment scheduled  Lab Results   Component Value Date    JC Virus AB POSITIVE 08/13/2016    Hep B Surface Ag Nonreactive 03/19/2019    Hep B S Ab Nonreactive 03/12/2018    Hep B Surf Ab Quant <8.00 03/12/2018    Hep B Core Total Ab Nonreactive 03/19/2019    Hepatitis C Ab Reactive (A) 07/06/2018    HIV Antigen/Antibody Combo Nonreactive 03/12/2018    HIV Antigen/Antibody Combo Nonreactive 07/21/2014        No results found for this or any previous visit.      No results found for this or any previous visit.      No results found for this or any previous visit.  Last Visit Date: Visit date not found  Next Visit Date: Visit date not found    Lab Results   Component Value Date    JC Virus AB POSITIVE 08/13/2016    Hep B Surface Ag Nonreactive 03/19/2019    Hep B S Ab Nonreactive 03/12/2018    Hep B Surf Ab Quant <8.00 03/12/2018    Hep B Core Total Ab Nonreactive 03/19/2019    Hepatitis C Ab Reactive (A) 07/06/2018    HIV Antigen/Antibody Combo Nonreactive 03/12/2018    HIV Antigen/Antibody Combo Nonreactive 07/21/2014        No results found for this or any previous visit.      No results found for this or any previous visit.      No results found for this or any previous visit.

## 2021-11-12 MED ORDER — KESIMPTA PEN 20 MG/0.4 ML SUBCUTANEOUS PEN INJECTOR
SUBCUTANEOUS | 0 refills | 28 days | Status: CP
Start: 2021-11-12 — End: ?
  Filled 2021-11-19: qty 0.4, 28d supply, fill #0

## 2021-11-13 NOTE — Unmapped (Signed)
Refill Kesimpta for one month.   Neurology update:  Patient is past due for clinic visit.  My chart message and /or letter sent to patient.  Staff asked to call and schedule.

## 2021-11-13 NOTE — Unmapped (Signed)
The numbers on file for patient and mom are not working. Sent [employer].           Comments:   Check up

## 2021-11-14 MED ORDER — MODAFINIL 200 MG TABLET
ORAL_TABLET | 0 refills | 0 days
Start: 2021-11-14 — End: ?

## 2021-11-14 NOTE — Unmapped (Signed)
[facility] Refill Coordination Note    Specialty Medication(s) to be Shipped:   Neurology: Kesimpta    Other medication(s) to be shipped: No additional medications requested for fill at this time     Donna Mata, DOB: [birth year]  Phone: [phone] (home)       All above HIPAA information was verified with patient.     Was a Nurse, learning disability used for this call? No    Completed refill call assessment today to schedule patient's medication shipment from the [facility] [phone]).  All relevant notes have been reviewed.     Specialty medication(s) and dose(s) confirmed: Regimen is correct and unchanged.   Changes to medications: Quadira reports no changes at this time.  Changes to insurance: No  New side effects reported not previously addressed with a pharmacist or physician: None reported  Questions for the pharmacist: No    Confirmed patient received a Conservation officer, historic buildings and a Surveyor, mining with first shipment. The patient will receive a drug information handout for each medication shipped and additional FDA Medication Guides as required.       DISEASE/MEDICATION-SPECIFIC INFORMATION        For patients on injectable medications: Patient currently has 0 doses left.  Next injection is scheduled for 11/21/21 .    SPECIALTY MEDICATION ADHERENCE     Medication Adherence    Patient reported X missed doses in the last month: 0  Specialty Medication: Kesimpta  Patient is on additional specialty medications: No  Patient is on more than two specialty medications: No  Any gaps in refill history greater than 2 weeks in the last 3 months: no  Demonstrates understanding of importance of adherence: yes   Other adherence tool: It is part of patient's routine.               Were doses missed due to medication being on hold? No    Kesimpta 20mg /0.10ml: Patient has 0 days of medication on hand    REFERRAL TO PHARMACIST     Referral to the pharmacist: Not needed      [facility] Shipping address confirmed in Epic.     Delivery Scheduled: Yes, Expected medication delivery date: 11/20/21 .     Medication will be delivered via UPS to the prescription address in Epic W[facility].    Ricci Barker   [facility] Specialty Technician

## 2021-11-15 MED ORDER — PROVIGIL 200 MG TABLET
ORAL_TABLET | Freq: Two times a day (BID) | ORAL | 0 refills | 30 days | Status: CP
Start: 2021-11-15 — End: ?

## 2021-11-15 MED ORDER — MODAFINIL 200 MG TABLET
ORAL_TABLET | 0 refills | 0 days
Start: 2021-11-15 — End: ?

## 2021-11-17 MED ORDER — MODAFINIL 200 MG TABLET
ORAL_TABLET | 0 refills | 0 days
Start: 2021-11-17 — End: ?

## 2021-11-18 MED ORDER — MODAFINIL 200 MG TABLET
ORAL_TABLET | Freq: Two times a day (BID) | ORAL | 5 refills | 30 days | Status: CP
Start: 2021-11-18 — End: 2022-11-18

## 2021-11-19 MED ORDER — MODAFINIL 200 MG TABLET
ORAL_TABLET | Freq: Two times a day (BID) | ORAL | 5 refills | 30 days
Start: 2021-11-19 — End: 2022-11-19

## 2021-11-21 NOTE — Unmapped (Signed)
Duplicate message. 

## 2021-11-22 MED ORDER — MODAFINIL 200 MG TABLET
ORAL_TABLET | Freq: Two times a day (BID) | ORAL | 5 refills | 30.00000 days | Status: CP
Start: 2021-11-22 — End: 2022-11-22

## 2021-12-06 DIAGNOSIS — G35 Multiple sclerosis: Principal | ICD-10-CM

## 2021-12-06 MED ORDER — KESIMPTA PEN 20 MG/0.4 ML SUBCUTANEOUS PEN INJECTOR
SUBCUTANEOUS | 0 refills | 28 days
Start: 2021-12-06 — End: ?

## 2021-12-07 NOTE — Unmapped (Signed)
Last Visit Date: 04/18/2021  Next Visit Date: 12/18/2021    Lab Results   Component Value Date    JC Virus AB POSITIVE 08/13/2016    Hep B Surface Ag Nonreactive 03/19/2019    Hep B S Ab Nonreactive 03/12/2018    Hep B Surf Ab Quant <8.00 03/12/2018    Hep B Core Total Ab Nonreactive 03/19/2019    Hepatitis C Ab Reactive (A) 07/06/2018    HIV Antigen/Antibody Combo Nonreactive 03/12/2018    HIV Antigen/Antibody Combo Nonreactive 07/21/2014        No results found for this or any previous visit.      No results found for this or any previous visit.      No results found for this or any previous visit.

## 2021-12-08 MED ORDER — KESIMPTA PEN 20 MG/0.4 ML SUBCUTANEOUS PEN INJECTOR
SUBCUTANEOUS | 0 refills | 28 days | Status: CP
Start: 2021-12-08 — End: ?
  Filled 2021-12-25: qty 0.4, 28d supply, fill #0

## 2021-12-21 NOTE — Unmapped (Signed)
[facility] Prior Authorization Request:       MyChart message from patient:    I need you to contact my insurance again about the modafiniL. If I see says it has pre-authorization again this month. Can you do it as soon as possible. Last time it took two weeks for [name] to call the insurance. I really need her to do it today because it is the weekend. I need it done today             Who Called?:    Donna Mata     Call back number:  [phone]    Medication: Modafinil 200 mg    Pharmacy:  [facility]    Did patient have recent insurance change?  NO    If yes, any new info given by caller:   N/A

## 2021-12-24 NOTE — Unmapped (Signed)
[facility] Refill Coordination Note    Anoop Amend [place], [place]: [birth year]  Phone: [phone] (home)       All above HIPAA information was verified with patient.         12/24/2021     3:40 PM   Specialty Rx Medication Refill Questionnaire   Which Medications would you like refilled and shipped? KESIMPTA PEN 20 mg/0.4 mL Pnij (ofatumumab)   Please list all current allergies: None   Have you missed any doses in the last 30 days? No   Have you had any changes to your medication(s) since your last refill? No   How many days remaining of each medication do you have at home? 0   Have you experienced any side effects in the last 30 days? No   Please enter the full address (street address, city, state, zip code) where you would like your medication(s) to be delivered to. [address] [place],  [address]   Please specify on which day you would like your medication(s) to arrive. Note: if you need your medication(s) within 3 days, please call the pharmacy to schedule your order at [phone]  12/26/2021   Has your insurance changed since your last refill? No   Would you like a pharmacist to call you to discuss your medication(s)? No   Do you require a signature for your package? (Note: if we are billing Medicare Part B or your order contains a controlled substance, we will require a signature) No         Completed refill call assessment today to schedule patient's medication shipment from the [facility] [phone]).  All relevant notes have been reviewed.       Confirmed patient received a Conservation officer, historic buildings and a Surveyor, mining with first shipment. The patient will receive a drug information handout for each medication shipped and additional FDA Medication Guides as required.         REFERRAL TO PHARMACIST     Referral to the pharmacist: Not needed      [facility]     Shipping address confirmed in Epic.     Delivery Scheduled: Yes, Expected medication delivery date: 12/26/21 .     Medication will be delivered via UPS to the prescription address in Epic WAM.    Ricci Barker   [facility] Specialty Technician

## 2022-01-18 DIAGNOSIS — G35 Multiple sclerosis: Principal | ICD-10-CM

## 2022-01-18 MED ORDER — KESIMPTA PEN 20 MG/0.4 ML SUBCUTANEOUS PEN INJECTOR
SUBCUTANEOUS | 0 refills | 28 days
Start: 2022-01-18 — End: ?

## 2022-01-18 NOTE — Unmapped (Signed)
[facility] Refill Coordination Note    Specialty Medication(s) to be Shipped:   Neurology: Kesimpta    Other medication(s) to be shipped: No additional medications requested for fill at this time     Luanne Jernberg, DOB: 25-Jun-2000  Phone: [phone] (home)       All above HIPAA information was verified with patient's family member, mom  .     Was a Nurse, learning disability used for this call? No    Completed refill call assessment today to schedule patient's medication shipment from the [facility] [phone]).  All relevant notes have been reviewed.     Specialty medication(s) and dose(s) confirmed: Regimen is correct and unchanged.   Changes to medications: Vauna reports no changes at this time.  Changes to insurance: No  New side effects reported not previously addressed with a pharmacist or physician: None reported  Questions for the pharmacist: No    Confirmed patient received a Conservation officer, historic buildings and a Surveyor, mining with first shipment. The patient will receive a drug information handout for each medication shipped and additional FDA Medication Guides as required.       DISEASE/MEDICATION-SPECIFIC INFORMATION        For patients on injectable medications: Patient currently has 0 doses left.  Next injection is scheduled for unable  to  verify   .    SPECIALTY MEDICATION ADHERENCE     Medication Adherence    Patient reported X missed doses in the last month: 0  Specialty Medication: KESIMPTA PEN 20 mg/0.4 mL  Patient is on additional specialty medications: No  Patient is on more than two specialty medications: No  Any gaps in refill history greater than 2 weeks in the last 3 months: no       Other adherence tool: It is part of patient's routine.                   Were doses missed due to medication being on hold? No    Kesimpta 20mg /0.43ml: Patient has 0 days of medication on hand    REFERRAL TO PHARMACIST     Referral to the pharmacist: Not needed      [facility] Shipping address confirmed in Epic.     Delivery Scheduled: Yes, Expected medication delivery date: 01/24/22 .  However, Rx request for refills was sent to the provider as there are none remaining.     Medication will be delivered via UPS to the prescription address in Epic WAM.    Ricci Barker   [facility] Specialty Technician

## 2022-01-18 NOTE — Unmapped (Signed)
Request received via interface.     Provider: [name]    Last Visit Date: 04/18/2021    Next Visit Date: Visit date not found    Lab Results   Component Value Date    JC Virus AB POSITIVE 08/13/2016    Hep B Surface Ag Nonreactive 03/19/2019    Hep B S Ab Nonreactive 03/12/2018    Hep B Surf Ab Quant <8.00 03/12/2018    Hep B Core Total Ab Nonreactive 03/19/2019    Hepatitis C Ab Reactive (A) 07/06/2018    HIV Antigen/Antibody Combo Nonreactive 03/12/2018    HIV Antigen/Antibody Combo Nonreactive 07/21/2014        No results found for this or any previous visit.      No results found for this or any previous visit.      No results found for this or any previous visit.

## 2022-01-19 DIAGNOSIS — G35 Multiple sclerosis: Principal | ICD-10-CM

## 2022-01-19 MED ORDER — KESIMPTA PEN 20 MG/0.4 ML SUBCUTANEOUS PEN INJECTOR
SUBCUTANEOUS | 0 refills | 28 days
Start: 2022-01-19 — End: ?

## 2022-01-19 NOTE — Unmapped (Signed)
Neurology update:  Patient is past due for clinic visit.  My chart message and /or letter sent to patient.    Last labs 07/2020 and last visit 03/2021.  Holding Kesimpta refill until I have safety labs.

## 2022-01-21 NOTE — Unmapped (Signed)
FAX CONFIRMATION     Orders/documents that were faxed: Lab Orders  Facility/location it was faxed to: [facility]  Phone:  Fax: [phone]  Time sent: 1445  Fax confirmation received and document shredded

## 2022-01-25 NOTE — Unmapped (Signed)
lab results needed  Received: Today  [name], Donna [name], PA  Donna Mata, [name], CMA  Donna Mata, Can you please:  -otain lab results that were sent to lab orders to [address] Donna Mata [place], [place] [address]?    Thank you, Donna  01/19/2022 last lab done .    Call request last labs was done  01/19/2022 will fax to [phone] .

## 2022-01-25 NOTE — Unmapped (Signed)
Donna Mata 's Kesimpta shipment will be canceled  as a result of MD denied refill request     I have reached out to the patient  at [phone] and communicated the delay. We will not reschedule the medication and have removed this/these medication(s) from the work request.  We have canceled this work request.

## 2022-01-28 NOTE — Unmapped (Signed)
Spoke to mother regarding booking appointment requested by daughter. Daughter is currently not available mother states she will have her call us back to book her appointment.

## 2022-02-04 DIAGNOSIS — G35 Multiple sclerosis: Principal | ICD-10-CM

## 2022-02-04 MED ORDER — KESIMPTA PEN 20 MG/0.4 ML SUBCUTANEOUS PEN INJECTOR
SUBCUTANEOUS | 0 refills | 84 days | Status: CP
Start: 2022-02-04 — End: ?
  Filled 2022-02-06: qty 0.4, 28d supply, fill #0

## 2022-02-05 NOTE — Unmapped (Signed)
[facility] Refill Coordination Note    Donna Mata, [place]: July 29, 2000  Phone: [phone] (home)       All above HIPAA information was verified with patient.         02/05/2022    11:01 AM   Specialty Rx Medication Refill Questionnaire   Which Medications would you like refilled and shipped? Kesimpta   Please list all current allergies: None   Have you missed any doses in the last 30 days? No   Have you had any changes to your medication(s) since your last refill? No   How many days remaining of each medication do you have at home? 0   Have you experienced any side effects in the last 30 days? No   Please enter the full address (street address, city, state, zip code) where you would like your medication(s) to be delivered to. [address] [place]  [address]   Please specify on which day you would like your medication(s) to arrive. Note: if you need your medication(s) within 3 days, please call the pharmacy to schedule your order at [phone]  02/07/2022   Has your insurance changed since your last refill? No   Would you like a pharmacist to call you to discuss your medication(s)? No   Do you require a signature for your package? (Note: if we are billing Medicare Part B or your order contains a controlled substance, we will require a signature) No         Completed refill call assessment today to schedule patient's medication shipment from the [facility] [phone]).  All relevant notes have been reviewed.       Confirmed patient received a Conservation officer, historic buildings and a Surveyor, mining with first shipment. The patient will receive a drug information handout for each medication shipped and additional FDA Medication Guides as required.         REFERRAL TO PHARMACIST     Referral to the pharmacist: Not needed      [facility]     Shipping address confirmed in Epic.     Delivery Scheduled: Yes, Expected medication delivery date: 02/07/22.     Medication will be delivered via UPS to the prescription address in Epic WAM.    Arnold Long, PharmD   [facility] Specialty Pharmacist

## 2022-02-11 DIAGNOSIS — G35 Multiple sclerosis: Principal | ICD-10-CM

## 2022-02-13 NOTE — Unmapped (Signed)
Called to schedule patient appt regarding below request.     Spoke with family member, will have patient return call to schedule appt.             Purvis Sheffield, RN P [place] [facility] Scheduling And Referrals [place]  Good morning.  Can you please call this patient to schedule a routine appointment for her with Glade Stanford? She is due for her 6 month follow up now.  Thanks.  Alan Ripper

## 2022-02-13 NOTE — Unmapped (Signed)
Received: 2 days ago  [place], Donna Mata, Donna Mata  Yehuda Budd, Ethan, CMA  [place], Can you please:  - send pain clinic referral to patient. She will need to find a provider near her.    Thank you, [name]    Copy of referral for pain clinic sent thru Mychart per the request of [name] [name].

## 2022-02-14 NOTE — Unmapped (Signed)
10.19.23  Called patient to schedule per request below   Spoke with family member will have pt return call to office to schedule appt         Purvis Sheffield, RN  [facility] [place] [facility] Scheduling [facility] Referrals [place]    Good afternoon.    Can you please call this patient to schedule a routine follow up appointment with [name]?    Thanks.  Alan Ripper

## 2022-03-05 NOTE — Unmapped (Signed)
[facility] Refill Coordination Note    Donna Mata [place], [place]: 07-29-2000  Phone: [phone] (home)       All above HIPAA information was verified with patient.         03/05/2022     1:02 PM   Specialty Rx Medication Refill Questionnaire   Which Medications would you like refilled and shipped? Kesimpta   Please list all current allergies: None   Have you missed any doses in the last 30 days? No   Have you had any changes to your medication(s) since your last refill? No   How many days remaining of each medication do you have at home? 0   Have you experienced any side effects in the last 30 days? No   Please enter the full address (street address, city, state, zip code) where you would like your medication(s) to be delivered to. [address] [place] [address]   Please specify on which day you would like your medication(s) to arrive. Note: if you need your medication(s) within 3 days, please call the pharmacy to schedule your order at [phone]  03/08/2022   Has your insurance changed since your last refill? No   Would you like a pharmacist to call you to discuss your medication(s)? No   Do you require a signature for your package? (Note: if we are billing Medicare Part B or your order contains a controlled substance, we will require a signature) No         Completed refill call assessment today to schedule patient's medication shipment from the [facility] [phone]).  All relevant notes have been reviewed.       Confirmed patient received a Conservation officer, historic buildings and a Surveyor, mining with first shipment. The patient will receive a drug information handout for each medication shipped and additional FDA Medication Guides as required.         REFERRAL TO PHARMACIST     Referral to the pharmacist: Not needed      [facility]     Shipping address confirmed in Epic.     Delivery Scheduled: Yes, Expected medication delivery date: 03/08/22.     Medication will be delivered via UPS to the prescription address in Epic WAM.    Arnold Long, PharmD   [facility] Specialty Pharmacist

## 2022-03-07 MED FILL — KESIMPTA PEN 20 MG/0.4 ML SUBCUTANEOUS PEN INJECTOR: SUBCUTANEOUS | 28 days supply | Qty: 0.4 | Fill #1

## 2022-03-29 NOTE — Unmapped (Signed)
[facility] Refill Coordination Note    Donna Mata, [place]: February 26, 2001  Phone: [phone] (home)       All above HIPAA information was verified with patient.         03/28/2022     6:29 PM   Specialty Rx Medication Refill Questionnaire   Which Medications would you like refilled and shipped? KESIMPTA PEN 20 mg/0.4 mL Pnij (ofatumumab)*   Please list all current allergies: None   Have you missed any doses in the last 30 days? No   Have you had any changes to your medication(s) since your last refill? No   How many days remaining of each medication do you have at home? 0   Have you experienced any side effects in the last 30 days? No   Please enter the full address (street address, city, state, zip code) where you would like your medication(s) to be delivered to. [address] [place]  [address]   Please specify on which day you would like your medication(s) to arrive. Note: if you need your medication(s) within 3 days, please call the pharmacy to schedule your order at [phone]  04/03/2022   Has your insurance changed since your last refill? No   Would you like a pharmacist to call you to discuss your medication(s)? No   Do you require a signature for your package? (Note: if we are billing Medicare Part B or your order contains a controlled substance, we will require a signature) No         Completed refill call assessment today to schedule patient's medication shipment from the [facility] [phone]).  All relevant notes have been reviewed.       Confirmed patient received a Conservation officer, historic buildings and a Surveyor, mining with first shipment. The patient will receive a drug information handout for each medication shipped and additional FDA Medication Guides as required.         REFERRAL TO PHARMACIST     Referral to the pharmacist: Not needed      [facility]     Shipping address confirmed in Epic.     Delivery Scheduled: Yes, Expected medication delivery date: 04/03/22 .     Medication will be delivered via UPS to the prescription address in Epic WAM.    Ricci Barker   [facility] Specialty Technician

## 2022-04-02 MED FILL — KESIMPTA PEN 20 MG/0.4 ML SUBCUTANEOUS PEN INJECTOR: SUBCUTANEOUS | 28 days supply | Qty: 0.4 | Fill #2

## 2022-04-10 MED ORDER — AMITRIPTYLINE 25 MG TABLET
ORAL_TABLET | Freq: Every evening | ORAL | 5 refills | 30 days
Start: 2022-04-10 — End: ?

## 2022-04-10 NOTE — Unmapped (Unsigned)
Last Visit Date: 04/18/2021  Next Visit Date: 04/30/2022  Last seen [facility]        Lab Results   Component Value Date    JC Virus AB POSITIVE 08/13/2016    Hep B Surface Ag Nonreactive 03/19/2019    Hep B S Ab Nonreactive 03/12/2018    Hep B Surf Ab Quant <8.00 03/12/2018    Hep B Core Total Ab Nonreactive 03/19/2019    Hepatitis C Ab Reactive (A) 07/06/2018    HIV Antigen/Antibody Combo Nonreactive 03/12/2018    HIV Antigen/Antibody Combo Nonreactive 07/21/2014        No results found for this or any previous visit.      No results found for this or any previous visit.      No results found for this or any previous visit.

## 2022-04-12 MED ORDER — AMITRIPTYLINE 25 MG TABLET
ORAL_TABLET | Freq: Every evening | ORAL | 5 refills | 30 days
Start: 2022-04-12 — End: ?

## 2022-04-12 NOTE — Unmapped (Signed)
Last Visit Date: 04/18/2021  Next Visit Date: 04/30/2022  Last seen by [facility]  Lab Results   Component Value Date    JC Virus AB POSITIVE 08/13/2016    Hep B Surface Ag Nonreactive 03/19/2019    Hep B S Ab Nonreactive 03/12/2018    Hep B Surf Ab Quant <8.00 03/12/2018    Hep B Core Total Ab Nonreactive 03/19/2019    Hepatitis C Ab Reactive (A) 07/06/2018    HIV Antigen/Antibody Combo Nonreactive 03/12/2018    HIV Antigen/Antibody Combo Nonreactive 07/21/2014        No results found for this or any previous visit.      No results found for this or any previous visit.      No results found for this or any previous visit.

## 2022-04-15 MED ORDER — CYCLOBENZAPRINE 5 MG TABLET
ORAL_TABLET | 1 refills | 0 days
Start: 2022-04-15 — End: ?

## 2022-04-16 NOTE — Unmapped (Signed)
Request received via interface.     Provider: [name]    Last Visit Date: 04/18/2021    Next Visit Date: 04/30/2022    Lab Results   Component Value Date    JC Virus AB POSITIVE 08/13/2016    Hep B Surface Ag Nonreactive 03/19/2019    Hep B S Ab Nonreactive 03/12/2018    Hep B Surf Ab Quant <8.00 03/12/2018    Hep B Core Total Ab Nonreactive 03/19/2019    Hepatitis C Ab Reactive (A) 07/06/2018    HIV Antigen/Antibody Combo Nonreactive 03/12/2018    HIV Antigen/Antibody Combo Nonreactive 07/21/2014        No results found for this or any previous visit.      No results found for this or any previous visit.      No results found for this or any previous visit.

## 2022-04-17 MED ORDER — CYCLOBENZAPRINE 5 MG TABLET
ORAL_TABLET | 0 refills | 0 days | Status: CP
Start: 2022-04-17 — End: ?

## 2022-04-23 DIAGNOSIS — G35 Multiple sclerosis: Principal | ICD-10-CM

## 2022-04-23 MED ORDER — KESIMPTA PEN 20 MG/0.4 ML SUBCUTANEOUS PEN INJECTOR
SUBCUTANEOUS | 0 refills | 84 days
Start: 2022-04-23 — End: ?

## 2022-04-23 NOTE — Unmapped (Signed)
Request received via interface.     Provider: [name], PA     Last Visit Date: 04/18/2021  Next Visit Date: 04/30/2022    Lab Results   Component Value Date    JC Virus AB POSITIVE 08/13/2016    Hep B Surface Ag Nonreactive 03/19/2019    Hep B S Ab Nonreactive 03/12/2018    Hep B Surf Ab Quant <8.00 03/12/2018    Hep B Core Total Ab Nonreactive 03/19/2019    Hepatitis C Ab Reactive (A) 07/06/2018    HIV Antigen/Antibody Combo Nonreactive 03/12/2018    HIV Antigen/Antibody Combo Nonreactive 07/21/2014        No results found for this or any previous visit.      No results found for this or any previous visit.      No results found for this or any previous visit.

## 2022-04-24 NOTE — Unmapped (Signed)
[facility] Refill Coordination Note    Gabryella Aslan [place], [place]: [birth year]  Phone: [phone] (home)       All above HIPAA information was verified with patient.         04/23/2022     4:12 PM   Specialty Rx Medication Refill Questionnaire   Which Medications would you like refilled and shipped? Kesimpta   Please list all current allergies: None   Have you missed any doses in the last 30 days? No   Have you had any changes to your medication(s) since your last refill? No   How many days remaining of each medication do you have at home? 0   Have you experienced any side effects in the last 30 days? No   Please enter the full address (street address, city, state, zip code) where you would like your medication(s) to be delivered to. [address] [place]  [address]   Please specify on which day you would like your medication(s) to arrive. Note: if you need your medication(s) within 3 days, please call the pharmacy to schedule your order at [phone]  04/26/2022   Has your insurance changed since your last refill? No   Would you like a pharmacist to call you to discuss your medication(s)? No   Do you require a signature for your package? (Note: if we are billing Medicare Part B or your order contains a controlled substance, we will require a signature) No         Completed refill call assessment today to schedule patient's medication shipment from the [facility] [phone]).  All relevant notes have been reviewed.       Confirmed patient received a Conservation officer, historic buildings and a Surveyor, mining with first shipment. The patient will receive a drug information handout for each medication shipped and additional FDA Medication Guides as required.         REFERRAL TO PHARMACIST     Referral to the pharmacist: Not needed      [facility]     Shipping address confirmed in Epic.     Delivery Scheduled: Yes, Expected medication delivery date: 04/26/22.  However, Rx request for refills was sent to the provider as there are none remaining.     Medication will be delivered via UPS to the prescription address in Epic WAM.    Unk Lightning   [facility] Specialty Technician

## 2022-04-25 NOTE — Unmapped (Signed)
Tayelor Fabien 's Kesimpta shipment will be delayed as a result of no refills remain on the prescription.      I have reached out to the patient  at [phone] and left a voicemail message.  We will call the patient back to reschedule the delivery upon resolution. We have not confirmed the new delivery date.

## 2022-04-26 MED ORDER — KESIMPTA PEN 20 MG/0.4 ML SUBCUTANEOUS PEN INJECTOR
SUBCUTANEOUS | 0 refills | 28 days | Status: CP
Start: 2022-04-26 — End: ?

## 2022-04-30 ENCOUNTER — Ambulatory Visit
Admit: 2022-04-30 | Discharge: 2022-05-01 | Payer: PRIVATE HEALTH INSURANCE | Attending: Physician Assistant | Primary: Physician Assistant

## 2022-04-30 DIAGNOSIS — G35 Multiple sclerosis: Principal | ICD-10-CM

## 2022-04-30 LAB — CBC W/ AUTO DIFF
BASOPHILS ABSOLUTE COUNT: 0.1 10*9/L (ref 0.0–0.1)
BASOPHILS RELATIVE PERCENT: 0.9 %
EOSINOPHILS ABSOLUTE COUNT: 0.1 10*9/L (ref 0.0–0.5)
EOSINOPHILS RELATIVE PERCENT: 1.4 %
HEMATOCRIT: 39.8 % (ref 34.0–44.0)
HEMOGLOBIN: 13.6 g/dL (ref 11.3–14.9)
LYMPHOCYTES ABSOLUTE COUNT: 2.1 10*9/L (ref 1.1–3.6)
LYMPHOCYTES RELATIVE PERCENT: 23.6 %
MEAN CORPUSCULAR HEMOGLOBIN CONC: 34.2 g/dL (ref 32.0–36.0)
MEAN CORPUSCULAR HEMOGLOBIN: 28.9 pg (ref 25.9–32.4)
MEAN CORPUSCULAR VOLUME: 84.4 fL (ref 77.6–95.7)
MEAN PLATELET VOLUME: 9.5 fL (ref 6.8–10.7)
MONOCYTES ABSOLUTE COUNT: 0.5 10*9/L (ref 0.3–0.8)
MONOCYTES RELATIVE PERCENT: 6.2 %
NEUTROPHILS ABSOLUTE COUNT: 6 10*9/L (ref 1.8–7.8)
NEUTROPHILS RELATIVE PERCENT: 67.9 %
PLATELET COUNT: 343 10*9/L (ref 150–450)
RED BLOOD CELL COUNT: 4.71 10*12/L (ref 3.95–5.13)
RED CELL DISTRIBUTION WIDTH: 13.9 % (ref 12.2–15.2)
WBC ADJUSTED: 8.8 10*9/L (ref 3.6–11.2)

## 2022-04-30 LAB — HEPATITIS C ANTIBODY: HEPATITIS C ANTIBODY: NONREACTIVE

## 2022-04-30 MED ORDER — MODAFINIL 200 MG TABLET
ORAL_TABLET | Freq: Two times a day (BID) | ORAL | 1 refills | 90 days | Status: CP
Start: 2022-04-30 — End: 2023-04-30

## 2022-04-30 MED ORDER — AMITRIPTYLINE 25 MG TABLET
ORAL_TABLET | Freq: Every evening | ORAL | 1 refills | 90 days | Status: CP
Start: 2022-04-30 — End: ?

## 2022-04-30 MED ORDER — CYCLOBENZAPRINE 5 MG TABLET
ORAL_TABLET | 0 refills | 0 days | Status: CN
Start: 2022-04-30 — End: ?

## 2022-04-30 MED ORDER — CYCLOBENZAPRINE 10 MG TABLET
ORAL_TABLET | Freq: Every evening | ORAL | 1 refills | 30 days | Status: CP | PRN
Start: 2022-04-30 — End: ?

## 2022-04-30 MED ORDER — KESIMPTA PEN 20 MG/0.4 ML SUBCUTANEOUS PEN INJECTOR
SUBCUTANEOUS | 1 refills | 112 days | Status: CP
Start: 2022-04-30 — End: ?
  Filled 2022-05-09: qty 0.4, 28d supply, fill #0

## 2022-04-30 MED ORDER — DULOXETINE 30 MG CAPSULE,DELAYED RELEASE
ORAL_CAPSULE | Freq: Every day | ORAL | 1 refills | 90 days | Status: CP
Start: 2022-04-30 — End: 2023-04-30

## 2022-04-30 NOTE — Unmapped (Signed)
[facility] at [facility]  Multiple Sclerosis / Neuroimmunology Division  Kaylene Dawn [name] [facility]  Physician Assistant    Phone: [phone]  Fax: [phone]      Patient Name: Donna Mata [facility]   Date of Birth: Nov 17, 2000  Medical Record Number: [id number]  [address] [place] [address]     Direct entry by:  Cy Blamer, PA-C.  Supervising Physician: Dr. Desma Mcgregor, Dr. Quillian Quince.    DATE OF VISIT: April 30, 2022    REASON FOR VISIT: Followup in the Neuroimmunology Clinic for evaluation of Multiple Sclerosis / Demyelinating Disease / or other Neurological problem.    I personally spent 55 minutes face-to-face and non-face-to-face in the care of this patient, which includes all pre, intra, and post visit time on the date of service.  All documented time was specific to the E/M visit and does not include any procedures that may have been performed.     **Seen by Dr. Johnnye Lana 04/24/2016.    ASSESSMENT AND PLAN:  A 22 y.o. Caucasian  female who has Fatigue; Rash; Obesity; Multiple sclerosis (CMS-HCC); Multiple sclerosis exacerbation (CMS-HCC); Neuropathic pain; Chronic bilateral low back pain without sciatica; Eye pain, bilateral; Type 2 diabetes mellitus without complication (CMS-HCC); Immunosuppression due to drug therapy (CMS-HCC); and BMI 50.0-59.9, adult (CMS-HCC) on their problem list.    ** Relapsing Remitting Multiple Sclerosis.  -Started Kesimpta 04/12/2019.    -Reviewed  with the patient 06/04/2021  MRI reports of Brain, Cervical , and Thoracic spine,  with / without contrast,  compared to 11/23/2019 which showed no new or enhancing lesions. And no Tspine lesions.  -Reviewed from 01/23/2022 in media tab CBC/d = wnl or NCS.  -Order yearly MRI of the  Brain, Cervical , and Thoracic spine without contrast  Schedule for  05/2022. Perform at [facility] in [place].  -Order CBC/diff and Vitamin D 25-OH at the clinic. Repeat HCV and RNA, (HCV previously + with neg RNA).    **Neuropathic symptoms and night time muscle spasms:  - Continue Amitriptyline to 75 mg at bedtime and flexeril 10mg  prn, refill.  -Start Cymbalta 30 mg daily.    **Fatigue:   -Continue Provigil 100mg  BID, needs generic. Amantaidne had no effect.    **Memory problems:  -Complete cognitive rehab with Speech therapy.  -Continue to monitor.    **Depression:  -Start Cymbalta 30 mg daily.    **Possible sleep apnea:  -Recommend sleep study via PCP locally. Order placed.    --Comprehensive testing completed.   -Follow up 6 months.    INTERVAL HISTORY / CHIEF COMPLAINT :  COMPREHENSIVE  ASSESSMENT:  Patient reports no MS relapses / sustained progression since the last visit 04/18/2021.    Current symptoms: (If blank =  none).  Bladder / bowel / sexual function: slight leakage, wears pad  Fatigue: on Provigil, interferes with ADL's >50 %..  Depression / decrease in mentation / Euphoria:  PHQ9 = 8, denies SI/HI. Gets angry a lot. Last took Wellbutrin 6-12 months ago, problem with insurance copay.    Vision/double vision:  Speech, swallowing problems:  Weakness:  Tingling/numbness/pain: Pain, tingling in legs when walking, sometime both wrists and arms. Coming and going but happens daily.  Balance/coordination problems:  Memory, mood: forgetful, losing things, trouble multi tasking.  Gait:  Falls:  Headaches:   Seizures:  Other symptoms: Difficulty falling and staying asleep, legs hurt at night, mother endorses snoring,  wakes up fatigue.     IMMUNIZATIONS / MAB / COVID infections:  Moderna COVID vaccine #1 on 03/05/2021 and #2 on 04/02/2021.    COORDINATION:  Dominant hand: right   Nine Hole Peg Test: RUE: 29.86s,  LUE 31.56s.    Normals: Age 11-24 R 12-20 L 12-22    Symbol Digit Modalities Test (SDMT):  Correct: 33  Completed: 33  Education: 13  Score Classification: -3.0.     Low: 1 standard deviation below average  Moderately Low: approximately 1.5 standard deviation below average   Very Low: approximately 2 standard deviation below average     Patient Health Questionnaire, PHQ9 = 8.  In the last two weeks how often have you been bothered by the following problems:    Little interest or pleasure in doing things......1.  Feeling  down, depressed or hopeless...[name]1.  Trouble falling asleep or stay asleep or sleeping too much...[name]1.  Feeling tired or having little energy...[name]1.  Poor appetite or overeating...[name]2.  Feeling bad about yourself or that you are a failure or have let yourself or family down...[name]1.  Trouble concentrating on things such as reading the newspaper or watching TV...[name]1.  Moving or speaking so slowly that other people could have noticed? Or the opposite being so fidgety or restless that you have been moving around a lot more than usual....0.  Thoughts that you would be better off dead or of hurting yourself in some way....0.    How difficult have these problems made it for you to do your work, take care of things at home or get along with other people.Marland KitchenMarland KitchenMarland KitchenSomewhat difficult.  Key  0 = not at all.  1 = several days.  2 = More than half of the days.  3 = Nearly every day.    VITAL SIGNS  BP 152/72 (BP Site: L Arm, BP Position: Sitting, BP Cuff Size: Thigh)  - Pulse 101  - Resp 16  - Ht 157.5 cm (5' 2)  - Wt (!) 143.9 kg (317 lb 3.2 oz)  - LMP 03/21/2022  - BMI 58.02 kg/m??     PHYSICAL EXAMINATION:  GENERAL:  Alert and oriented to person, place, time and situation.    Recent and remote memory intact.    Attention span and concentration normal.    Language and spontaneous speech normal, no dysarthria or aphasia.   Fund of knowledge normal.    Neurological Examination:     III, IV, VI- extra ocular movements are intact, No ptosis, no nystagmus.  Visual Acuity:  OD 20/20, -2, OS 20/20.  Reports no Scotoma.  Confrontation intact.  Fundoscopic exam:  Clear optic disc margins, no optic disc pallor     V- sensation of the face intact b/l.  VII- face symmetrical, no facial droop, normal facial movements with smile/grimace  VIII- Hearing grossly intact.  IX and X- symmetric palate contraction.  XI- Full shoulder shrug bilaterally  XII- Tongue protrudes midline, full range of movements of the tongue    Motor Exam:      Muscles UEs   LEs     R L   R L   Deltoids 5/5 5/5 Hip flexors  5/5 5/5   Biceps 5/5 5/5 Hip extensors 5/5 5/5   Triceps 5/5 5/5 Knee flexors 5/5 5/5   Hand grip 5/5 5/5 Knee extensors 5/5 5/5   Wrist flexors    5/5 5/5 Foot dorsal flexors 5/5 5/5   Wrist extensors 5/5 5/5 Foot plantar flexors 5/5 5/5  Finger flexors 5/5 5/5      Finger extensors 5/5 5/5            Normal bulk and tone. No spasticity.  No clonus.    Reflexes R L   Brachioradialis +2 +2   Biceps +2 +2   Triceps +2 +2   Patella +2 +2   Achilles +1 +1     Negative babinski.    Sensory UEs LEs    R L R L   Light touch WNL WNL WNL WNL   Pin prick WNL WNL WNL WNL   Vibration >10 seconds WNL WNL WNL WNL   Proprioception   WNL WNL      Cerebellar/Coordination:  Rapid alternating movements, finger-to-nose and heel-to-shin  bilaterally demonstrates no abnormalities.    Romberg was negative with eyes closed.    Gait: Normal stride, base and  armswing. Able to tandem, heel, and toe gait without difficulty.     T25FW = 6.82s.    EDSS = 5.5.  Functional Scores, FS.  Visual 1  Brainstem 0  Pyramidal 0  Cerebellar 0  Sensory 0  Bowel/ Bladder 2  Cerebral 2  Ambulation score:4     PRIOR HISTORY: A 21 y.o.caucasian female.  Patient presented to [facility] for parasthesias and weakness with notable multiple enhancing and non enhancing lesions throughout brain and spinal cord consistent with MS. Lesions not typical for ADEM and she had no prior viral illness or immunization before episode. Her lumbar puncture was also consistent with demyelinating disease with no evidence of infection and elevated IGG index with 4+ oligoclonal bands. Diagnosed with RRMS by Dr. Alberteen Spindle 09/2014 and started on Gilenya.    Hep C AB positive, Hep C RNA, quantitative,  PCR undetected. LFT's wnl.     Phone consult with Hepatology Dr Woodfin Ganja. repeated Hep C AB positve. Most likely false positive.  There is a 5% chance of vertical transmission.  If this were to be the case most children clear the virus spontaneously by age three. Recommendation would still be the same as above.    Discontinued amantadine due to lack of effect. Doing well on Provigil.  Reports muscle spasms in her right calf that occurs at night about three times a week and lasts for several hours to all night.  Lower extremity pain, numbness and tingling that is off and on daily. Currently taking Amitriptyline at bedtime.    LUMBAR PUNCTURE:  07/21/2014 Four or more Oligoclonal bands and IgG index = 0.8.    SCREENING LABS:   07/21/2014 : ACE 20, ANA neg, RF <6.3, , B12 420 , NMO APQ4 IgG neg and Vit D 25-OH 20.  04/17/2017  RPR,  Lyme serology and   MOG IgG1 are all negative.  03/12/2018 JCV positive, index = 3.51.    07/17/2017 Baseline:  T25FW= 4.98. seconds.  MMSE = 26.  PHQ9 = 8.    MS FLARE-UP HISTORY:  Received IVMP while in hospital at time of diagnoses.  04/24/2016 Left arm and left leg weakness, left-sided facial and body sensory disturbance with perception of pain, sleep disturbances in the setting of URI. Received 5 days of IVMP with 100% resolution.  04/26/2018  Left side weakness and  numbness and tingling from head to toe Seen at [facility] ED and received IV Steroids x one. Sent home with oral Prednisone for 4 days. !00% resolved.  Clinical flare-up from 05/01/2018 100% resolved. Symptoms were Numbness or tingling. Received one day of IV  steroids followed by four days of oral.  11/16/2018 Received 3 days of oral steroids 1250mg  for eye symptoms. Eye symptoms were unchanged with the steroids.    MS MEDICATION:  Gilenya started 12/13/2014 -08/2016.  Flare up 03/2016 that required 5 days of IVMP with 100% resolution. New ring enahnacing lesion seen on brain MRI and new cervical spine non enhancing lesion.  JCV = positive with index= 3.51.  Tecfidera 08/2016 - 03/2018.  induction of Ocrevus 300mg  on  04/23/2018 (partial) and 300mg  on  05/07/2018 (full) and  600mg  11/09/2018 ( partial). Thickness/knot/ irratation in throat, stomach burning. Discontinued 11/09/2018.  Oral prednisone monthly starting 11/2018.    GYN HISTORY HISTORY:  Menses started at age 11.    FAMILY HISTORY:  No MS. Mother under going work up for Lupus.  Great grandfather and grandmother with IDDM.    REVIEW OF SYSTEMS:  A 10-systems review was performed and, unless otherwise noted, declared negative by patient.    No visits with results within 1 Month(s) from this visit.   Latest known visit with results is:   Appointment on 08/08/2020   Component Date Value Ref Range Status    Albumin 08/08/2020 3.5  3.4 - 5.0 g/dL Final    Total Protein 08/08/2020 6.7  5.7 - 8.2 g/dL Final    Total Bilirubin 08/08/2020 0.2 (L)  0.3 - 1.2 mg/dL Final    Bilirubin, Direct 08/08/2020 <0.10  0.00 - 0.30 mg/dL Final    AST 16/01/9603 15  <=34 U/L Final    ALT 08/08/2020 18  10 - 49 U/L Final    Alkaline Phosphatase 08/08/2020 148 (H)  46 - 116 U/L Final    Vitamin D Total (25OH) 08/08/2020 25.9  20.0 - 80.0 ng/mL Final    WBC 08/08/2020 10.2  4.2 - 10.2 10*9/L Final    RBC 08/08/2020 4.47  3.95 - 5.13 10*12/L Final    HGB 08/08/2020 13.2  11.3 - 14.9 g/dL Final    HCT 54/12/8117 37.6  34.0 - 44.0 % Final    MCV 08/08/2020 84.2  77.6 - 95.7 fL Final    MCH 08/08/2020 29.4  25.9 - 32.4 pg Final    MCHC 08/08/2020 34.9  32.3 - 35.0 g/dL Final    RDW 14/78/2956 13.2  12.2 - 15.2 % Final    MPV 08/08/2020 9.3  7.3 - 10.7 fL Final    Platelet 08/08/2020 326  170 - 380 10*9/L Final    Neutrophils % 08/08/2020 70.8  % Final    Lymphocytes % 08/08/2020 21.3  % Final    Monocytes % 08/08/2020 5.7  % Final    Eosinophils % 08/08/2020 1.3  % Final    Basophils % 08/08/2020 0.9  % Final    Absolute Neutrophils 08/08/2020 7.2 (H)  1.5 - 6.4 10*9/L Final    Absolute Lymphocytes 08/08/2020 2.2  1.1 - 3.6 10*9/L Final    Absolute Monocytes 08/08/2020 0.6  0.3 - 0.8 10*9/L Final    Absolute Eosinophils 08/08/2020 0.1  0.0 - 0.5 10*9/L Final    Absolute Basophils 08/08/2020 0.1  0.0 - 0.1 10*9/L Final       No visits with results within 1 Month(s) from this visit.   Latest known visit with results is:   Appointment on 08/08/2020   Component Date Value Ref Range Status    Albumin 08/08/2020 3.5  3.4 - 5.0 g/dL Final    Total Protein 08/08/2020 6.7  5.7 - 8.2 g/dL Final  Total Bilirubin 08/08/2020 0.2 (L)  0.3 - 1.2 mg/dL Final    Bilirubin, Direct 08/08/2020 <0.10  0.00 - 0.30 mg/dL Final    AST 56/21/3086 15  <=34 U/L Final    ALT 08/08/2020 18  10 - 49 U/L Final    Alkaline Phosphatase 08/08/2020 148 (H)  46 - 116 U/L Final    Vitamin D Total (25OH) 08/08/2020 25.9  20.0 - 80.0 ng/mL Final    WBC 08/08/2020 10.2  4.2 - 10.2 10*9/L Final    RBC 08/08/2020 4.47  3.95 - 5.13 10*12/L Final    HGB 08/08/2020 13.2  11.3 - 14.9 g/dL Final    HCT 57/84/6962 37.6  34.0 - 44.0 % Final    MCV 08/08/2020 84.2  77.6 - 95.7 fL Final    MCH 08/08/2020 29.4  25.9 - 32.4 pg Final    MCHC 08/08/2020 34.9  32.3 - 35.0 g/dL Final    RDW 95/28/4132 13.2  12.2 - 15.2 % Final    MPV 08/08/2020 9.3  7.3 - 10.7 fL Final    Platelet 08/08/2020 326  170 - 380 10*9/L Final    Neutrophils % 08/08/2020 70.8  % Final    Lymphocytes % 08/08/2020 21.3  % Final    Monocytes % 08/08/2020 5.7  % Final    Eosinophils % 08/08/2020 1.3  % Final    Basophils % 08/08/2020 0.9  % Final    Absolute Neutrophils 08/08/2020 7.2 (H)  1.5 - 6.4 10*9/L Final    Absolute Lymphocytes 08/08/2020 2.2  1.1 - 3.6 10*9/L Final    Absolute Monocytes 08/08/2020 0.6  0.3 - 0.8 10*9/L Final    Absolute Eosinophils 08/08/2020 0.1  0.0 - 0.5 10*9/L Final    Absolute Basophils 08/08/2020 0.1  0.0 - 0.1 10*9/L Final       PROBLEM LIST:    Patient Active Problem List   Diagnosis    Fatigue    Rash    Obesity Multiple sclerosis (CMS-HCC)    Multiple sclerosis exacerbation (CMS-HCC)    Neuropathic pain    Chronic bilateral low back pain without sciatica    Eye pain, bilateral    Type 2 diabetes mellitus without complication (CMS-HCC)    Immunosuppression due to drug therapy (CMS-HCC)    BMI 50.0-59.9, adult (CMS-HCC)     Current Outpatient Medications   Medication Sig Dispense Refill    acetaminophen (TYLENOL ORAL) Take by mouth.      amitriptyline (ELAVIL) 25 MG tablet Take 3 tablets (75 mg total) by mouth nightly. 30 tablet 0    cholecalciferol, vitamin D3, 100 mcg (4,000 unit) cap Take by mouth.      cyclobenzaprine (FLEXERIL) 5 MG tablet TAKE 1 TABLET BY MOUTH NIGHTLY AS NEEDED FOR MUSCLE SPASMS 30 tablet 0    diphenhydramine HCl (BENADRYL ORAL) Take by mouth.      modafiniL (PROVIGIL) 200 MG tablet Take 0.5 tablets (100 mg total) by mouth two (2) times a day. Prescribe generic Modafinil. 30 tablet 5    naproxen (NAPROSYN) 500 MG tablet TAKE 1 TABLET BY MOUTH TWICE A DAY WITH MEALS      ofatumumab (KESIMPTA PEN) 20 mg/0.4 mL PnIj Inject the contents of 1 pen (20 mg) under the skin every twenty-eight (28) days. 0.4 mL 0    pantoprazole (PROTONIX) 20 MG tablet Take 1 tablet (20 mg total) by mouth Two (2) times a day. 180 tablet 1    promethazine (PHENERGAN) 12.5 MG tablet TAKE  1 TABLET (12.5 MG TOTAL) BY MOUTH EVERY SIX (6) HOURS AS NEEDED FOR NAUSEA. 30 tablet 2    buPROPion (WELLBUTRIN XL) 300 MG 24 hr tablet TAKE 1 TABLET BY MOUTH EVERY DAY IN THE MORNING (Patient not taking: Reported on 04/30/2022) 30 tablet 5    traZODone (DESYREL) 100 MG tablet Take 1 tablet (100 mg total) by mouth.       No current facility-administered medications for this visit.         Past Surgical Hx:    Past Surgical History:   Procedure Laterality Date    NO PAST SURGERIES         Social Hx:    Social History     Socioeconomic History    Marital status: Single     Spouse name: None    Number of children: None    Years of education: None Highest education level: None   Tobacco Use    Smoking status: Never    Smokeless tobacco: Never   Substance and Sexual Activity    Alcohol use: No     Alcohol/week: 0.0 standard drinks of alcohol    Drug use: No    Sexual activity: Never   Social History Narrative    Patient lives at home with Mom, stepfather, and 36 yo brother. She is in 8th grade and is currently not doing well in school. She denies bullying or other psychosocial stressors. She fells safe at home. She denies tobacco, alcohol, and drug use. She has never been sexually active. She is on OCPs to regular her periods. LMP was 2/28.        Family Hx:    Family History   Problem Relation Age of Onset    Diabetes type II Mother     Asthma Mother     Diabetes Mother     Hypertension Mother     No Known Problems Father     ADD / ADHD Brother     Cancer Maternal Grandmother     Glaucoma Neg Hx     Macular degeneration Neg Hx     Retinal detachment Neg Hx     Strabismus Neg Hx      ALLERGIES:    Allergies   Allergen Reactions    Ocrelizumab Anaphylaxis

## 2022-04-30 NOTE — Unmapped (Signed)
Below is your visit summary:    **Schedule your MRI's 05/2022.    -Reminder to stay up to date on all vaccines including Flu, updated 2023-2024 COVID vaccine and RSV and if not previously obtained the Shingles and Pneumonia vaccine. Stay current with all cancer screenings including dermatology, colonoscopy (Regular screening, beginning at age 22), and if you are a female: mammogram (ages 14 to 36 years ), pap smears . This can be obtained or arrange through the Primary provider.     **Follow up 6 - 7 months.    Thank you for choosing [facility]  We appreciate the opportunity to participate in your care. If you have questions or concerns, please do not hesitate to contact us by [facility] or by calling [phone] to speak with one of our clinical support team members.      [place] MyChart Website: [email or web address]      Appointment Scheduling: [phone]    Genasis Zingale Jannett Celestine PA-C, MPAS  Division of Multiple Sclerosis  [facility] at [facility]  [address]  [place], [place] [address]    Phone [phone]  Fax [phone]    Navigating the Challenges of MS: MS Navigator??   National MS Society: MS Navigator at [phone]  Finding answers and making decisions relies on having the right information at the right time. MS Navigator are highly skilled, compassionate professionals available Monday through Friday, 9:00 a.m. to 7:00 p.m. ET to connect you to the information, resources and support needed to move your life forward. These supportive partners help navigate the challenges of MS unique to your situation.    [facility] MS Clinic is a specialty clinic and there is a need for you to have a  primary care provider  who will take care of your non-neurological health.   Please set this up if you have not already done so.      In case of:  a suspected relapse (new symptoms or worsening existing symptoms, lasting for >24h)  OR  a need for an additional appointment for other reasons  OR   if you have other questions: please contact:       [facility] Desk   Phone: [phone]      Supportive Therapy, Patriciaann Clan, MSW, LCSW.        Phone [phone]    If you have questions for our  pharmacist, please call:      Worthy Flank, PharmD, CPP  Phone: [phone]      If you need financial assistance, please call:      Financial Assistance Unit      Phone: [phone]

## 2022-05-01 LAB — HEPATITIS C RNA, QUANTITATIVE, PCR: HCV RNA: NOT DETECTED

## 2022-05-02 LAB — VITAMIN D 25 HYDROXY: VITAMIN D, TOTAL (25OH): 30.9 ng/mL (ref 20.0–80.0)

## 2022-05-06 NOTE — Unmapped (Signed)
Donna Mata 's Kesimpta shipment will be delayed as a result of a new prescription for the medication has been received.      I have reached out to the patient  at [phone] and left a voicemail message.  We will wait for a call back from the patient to reschedule the delivery.  We have not confirmed the new delivery date.  New Rx received on 01/02.

## 2022-05-31 NOTE — Unmapped (Signed)
[facility] Refill Coordination Note    Specialty Medication(s) to be Shipped:   Neurology: Kesimpta    Other medication(s) to be shipped: No additional medications requested for fill at this time     Donna Mata, DOB: 08-20-2000  Phone: [phone] (home)       All above HIPAA information was verified with patient.     Was a Nurse, learning disability used for this call? No    Completed refill call assessment today to schedule patient's medication shipment from the [facility] [phone]).  All relevant notes have been reviewed.     Specialty medication(s) and dose(s) confirmed: Regimen is correct and unchanged.   Changes to medications: Shifra reports no changes at this time.  Changes to insurance: No  New side effects reported not previously addressed with a pharmacist or physician: None reported  Questions for the pharmacist: No    Confirmed patient received a Conservation officer, historic buildings and a Surveyor, mining with first shipment. The patient will receive a drug information handout for each medication shipped and additional FDA Medication Guides as required.       DISEASE/MEDICATION-SPECIFIC INFORMATION        For patients on injectable medications: Patient currently has 0 doses left.  Next injection is scheduled for  06/16/22   .    SPECIALTY MEDICATION ADHERENCE     Medication Adherence    Patient reported X missed doses in the last month: 0  Specialty Medication: KESIMPTA PEN 20 mg/0.4 mL  Patient is on additional specialty medications: No  Patient is on more than two specialty medications: No  Any gaps in refill history greater than 2 weeks in the last 3 months: no  Demonstrates understanding of importance of adherence: yes         Other adherence tool: It is part of patient's routine.                       Were doses missed due to medication being on hold? No    Kesimpta 20mg /0.62ml: Patient has 0 days of medication on hand    REFERRAL TO PHARMACIST     Referral to the pharmacist: Not needed      [facility]     Shipping address confirmed in Epic.     Delivery Scheduled: Yes, Expected medication delivery date: 06/11/22 .     Medication will be delivered via UPS to the prescription address in Epic WAM.    Ricci Barker   [facility] Specialty Technician

## 2022-06-10 MED FILL — KESIMPTA PEN 20 MG/0.4 ML SUBCUTANEOUS PEN INJECTOR: SUBCUTANEOUS | 28 days supply | Qty: 0.4 | Fill #1

## 2022-07-05 NOTE — Unmapped (Signed)
[facility] Pharmacy Refill Coordination Note    Donna Mata, [place]: 2000/10/29  Phone: [phone] (home)       All above HIPAA information was verified with patient.         07/04/2022     1:08 PM   Specialty Rx Medication Refill Questionnaire   Which Medications would you like refilled and shipped? KESIMPTA PEN 20 mg/0.4 mL Pnij (ofatumumab)   Please list all current allergies: None   Have you missed any doses in the last 30 days? No   Have you had any changes to your medication(s) since your last refill? No   How many days remaining of each medication do you have at home? 0   Have you experienced any side effects in the last 30 days? No   Please enter the full address (street address, city, state, zip code) where you would like your medication(s) to be delivered to. [address], [place] ,[place] ,[address]   Please specify on which day you would like your medication(s) to arrive. Note: if you need your medication(s) within 3 days, please call the pharmacy to schedule your order at [phone]  07/08/2022   Has your insurance changed since your last refill? No   Would you like a pharmacist to call you to discuss your medication(s)? No   Do you require a signature for your package? (Note: if we are billing Medicare Part B or your order contains a controlled substance, we will require a signature) No     Spoke to  pt  via phone  - she  is  okay  with   delivery  of  07/09/22     Completed refill call assessment today to schedule patient's medication shipment from the [facility] [phone]).  All relevant notes have been reviewed.       Confirmed patient received a Conservation officer, historic buildings and a Surveyor, mining with first shipment. The patient will receive a drug information handout for each medication shipped and additional FDA Medication Guides as required.         REFERRAL TO PHARMACIST     Referral to the pharmacist: Not needed      [facility]     Shipping address confirmed in Epic.     Delivery Scheduled: Yes, Expected medication delivery date: 07/09/22 .     Medication will be delivered via UPS to the prescription address in Epic WAM.    Ricci Barker   [facility] Specialty Technician

## 2022-07-08 MED FILL — KESIMPTA PEN 20 MG/0.4 ML SUBCUTANEOUS PEN INJECTOR: SUBCUTANEOUS | 28 days supply | Qty: 0.4 | Fill #2

## 2022-08-09 NOTE — Unmapped (Signed)
[facility] Special[facility] Pharm[facility] Refill Coordination Note    Specialty Medication(s) to be Shipped:   Neurology: Kesimpta    Other medication(s) to be shipped: No additional medications requested for fill at this time     Donna Mata, DOB: 22-Jul-2000  Phone: [phone] (home)       All above HIPAA information was verified with patient.     Was a Nurse, learning disability used for this call? No    Completed refill call assessment today to schedule patient's medication shipment from the [facility] [phone]).  All relevant notes have been reviewed.     Specialty medication(s) and dose(s) confirmed: Regimen is correct and unchanged.   Changes to medications: Jocely reports no changes at this time.  Changes to insurance: No  New side effects reported not previously addressed with a pharmacist or physician: None reported  Questions for the pharmacist: No    Confirmed patient received a Conservation officer, historic buildings and a Surveyor, mining with first shipment. The patient will receive a drug information handout for each medication shipped and additional FDA Medication Guides as required.       DISEASE/MEDICATION-SPECIFIC INFORMATION        For patients on injectable medications: Patient currently has 0 doses left.  Next injection is scheduled for  08/13/22   .    SPECIALTY MEDICATION ADHERENCE     Medication Adherence    Patient reported X missed doses in the last month: 0  Specialty Medication: KESIMPTA PEN 20 mg/0.4 mL  Patient is on additional specialty medications: No  Patient is on more than two specialty medications: No  Any gaps in refill history greater than 2 weeks in the last 3 months: no  Demonstrates understanding of importance of adherence: yes   Other adherence tool: It is part of patient's routine.                 Were doses missed due to medication being on hold? No    Kesimpta 20mg /0.79ml: Patient has 0 days of medication on hand    REFERRAL TO PHARMACIST     Referral to the pharmacist: Not needed      [facility]     Shipping address confirmed in Epic.     Delivery Scheduled: Yes, Expected medication delivery date: 08/13/22 .     Medication will be delivered via UPS to the prescription address in Epic WAM.    Ricci Barker   [facility] Specialty Technician

## 2022-08-12 MED FILL — KESIMPTA PEN 20 MG/0.4 ML SUBCUTANEOUS PEN INJECTOR: SUBCUTANEOUS | 28 days supply | Qty: 0.4 | Fill #3

## 2022-10-14 MED ORDER — MODAFINIL 200 MG TABLET
ORAL_TABLET | Freq: Two times a day (BID) | ORAL | 1 refills | 90 days
Start: 2022-10-14 — End: 2023-10-14

## 2022-10-14 NOTE — Unmapped (Signed)
[facility] Pharm[facility] Clinical Assessment & Refill Coordination Note    Donna Mata, DOB: 01/30/2001  Phone: [phone] (home)     All above HIPAA information was verified with patient.     Was a Nurse, learning disability used for this call? No    Specialty Medication(s):   Neurology: Kesimpta     Current Outpatient Medications   Medication Sig Dispense Refill    acetaminophen (TYLENOL ORAL) Take by mouth.      amitriptyline (ELAVIL) 25 MG tablet Take 3 tablets (75 mg total) by mouth nightly. 270 tablet 1    cholecalciferol, vitamin D3, 100 mcg (4,000 unit) cap Take by mouth.      cyclobenzaprine (FLEXERIL) 10 MG tablet Take 1 tablet (10 mg total) by mouth nightly as needed for muscle spasms. 30 tablet 1    diphenhydramine HCl (BENADRYL ORAL) Take by mouth.      DULoxetine (CYMBALTA) 30 MG capsule Take 1 capsule (30 mg total) by mouth daily. 90 capsule 1    modafinil (PROVIGIL) 200 MG tablet Take 0.5 tablets (100 mg total) by mouth two (2) times a day. Prescribe generic Modafinil. 90 tablet 1    naproxen (NAPROSYN) 500 MG tablet TAKE 1 TABLET BY MOUTH TWICE A DAY WITH MEALS      ofatumumab (KESIMPTA PEN) 20 mg/0.4 mL PnIj Inject the contents of 1 pen (20 mg) under the skin every twenty-eight (28) days. 1.6 mL 1    pantoprazole (PROTONIX) 20 MG tablet Take 1 tablet (20 mg total) by mouth Two (2) times a day. 180 tablet 1    promethazine (PHENERGAN) 12.5 MG tablet TAKE 1 TABLET (12.5 MG TOTAL) BY MOUTH EVERY SIX (6) HOURS AS NEEDED FOR NAUSEA. 30 tablet 2    traZODone (DESYREL) 100 MG tablet Take 1 tablet (100 mg total) by mouth.       No current facility-administered medications for this visit.        Changes to medications: Donna Mata reports no changes at this time.    Allergies   Allergen Reactions    Ocrelizumab Anaphylaxis       Changes to allergies: No    SPECIALTY MEDICATION ADHERENCE     Kesimpta 20  mg/0.34ml : 0 days of medicine on hand       Medication Adherence    Patient reported X missed doses in the last month: 1  Specialty Medication: Kesimpta  Patient is on additional specialty medications: No  Informant: patient   Other adherence tool: It is part of patient's routine.             Specialty medication(s) dose(s) confirmed: Regimen is correct and unchanged.     Are there any concerns with adherence? No    Adherence counseling provided? Not needed    CLINICAL MANAGEMENT AND INTERVENTION      Clinical Benefit Assessment:    Do you feel the medicine is effective or helping your condition? Yes    Clinical Benefit counseling provided? Not needed    Adverse Effects Assessment:    Are you experiencing any side effects? No    Are you experiencing difficulty administering your medicine? No    Quality of Life Assessment:    Quality of Life    Rheumatology  Oncology  Dermatology  Cystic Fibrosis          How many days over the past month did your MS  keep you from your normal activities? For example, brushing your teeth or getting  up in the morning. 0    Have you discussed this with your provider? Not needed    Acute Infection Status:    Acute infections noted within Epic:  No active infections  Patient reported infection: None    Therapy Appropriateness:    Is therapy appropriate and patient progressing towards therapeutic goals? Yes, therapy is appropriate and should be continued    DISEASE/MEDICATION-SPECIFIC INFORMATION      For patients on injectable medications: Patient currently has 0 doses left.  Next injection is scheduled for ASAP.    Multiple Sclerosis: Have you experienced any flares in the last month? No  Has this been reported to your provider? Not applicable  What was the outcome of the flare? Not applicable    PATIENT SPECIFIC NEEDS     Does the patient have any physical, cognitive, or cultural barriers? No    Is the patient high risk? No    Did the patient require a clinical intervention? No    Does the patient require physician intervention or other additional services (i.e., nutrition, smoking cessation, social work)? No    SOCIAL DETERMINANTS OF HEALTH     At the [facility], we have learned that life circumstances - like trouble affording food, housing, utilities, or transportation can affect the health of many of our patients.   That is why we wanted to ask: are you currently experiencing any life circumstances that are negatively impacting your health and/or quality of life? Patient declined to answer    Social Determinants of Health     Financial Resource Strain: Not on file   Internet Connectivity: Not on file   Food Insecurity: Not on file   Tobacco Use: Low Risk  (04/30/2022)    Patient History     Smoking Tobacco Use: Never     Smokeless Tobacco Use: Never     Passive Exposure: Not on file   Housing/Utilities: Unknown (08/06/2020)    Housing/Utilities     Within the past 12 months, have you ever stayed: outside, in a car, in a tent, in an overnight shelter, or temporarily in someone else's home (i.e. couch-surfing)?: No     Are you worried about losing your housing?: Not on file     Within the past 12 months, have you been unable to get utilities (heat, electricity) when it was really needed?: Not on file   Alcohol Use: Not At Risk (11/10/2018)    Received from [facility]    AUDIT-C     Frequency of Alcohol Consumption: Never     Average Number of Drinks: Not on file     Frequency of Binge Drinking: Not on file   Transportation Needs: Not on file   Substance Use: Not on file   Health Literacy: Not on file   Physical Activity: Not on file   Interpersonal Safety: Not on file   Stress: Not on file   Intimate Partner Violence: Unknown (08/02/2021)    Received from [facility]    HITS     Physically Hurt: Not on file     Insult or Talk Down To: Not on file     Threaten Physical Harm: Not on file     Scream or Curse: Not on file   Depression: Not on file   Social Connections: Unknown (09/11/2021)    Received from [facility]    Social Network     Social Network: Not on file Would you be willing to receive help  with any of the needs that you have identified today? Not applicable       SHIPPING     Specialty Medication(s) to be Shipped:   Neurology: Kesimpta    Other medication(s) to be shipped: No additional medications requested for fill at this time     Changes to insurance: No    Delivery Scheduled: Yes, Expected medication delivery date: 6/19.     Medication will be delivered via UPS to the confirmed prescription address in [facility]    The patient will receive a drug information handout for each medication shipped and additional FDA Medication Guides as required.  Verified that patient has previously received a Conservation officer, historic buildings and a Surveyor, mining.    The patient or caregiver noted above participated in the development of this care plan and knows that they can request review of or adjustments to the care plan at any time.      All of the patient's questions and concerns have been addressed.    Arnold Long, PharmD   [facility] Specialty Pharmacist

## 2022-10-14 NOTE — Unmapped (Signed)
Request received via interface.     Provider: [name]    Last Visit Date: 04/30/2022    Next Visit Date: Visit date not found, recall 10/27/2022    Lab Results   Component Value Date    JC Virus AB POSITIVE 08/13/2016    Hep B Surface Ag Nonreactive 03/19/2019    Hep B S Ab Nonreactive 03/12/2018    Hep B Surf Ab Quant <8.00 03/12/2018    Hep B Core Total Ab Nonreactive 03/19/2019    Hepatitis C Ab Nonreactive 04/30/2022    HIV Antigen/Antibody Combo Nonreactive 03/12/2018    HIV Antigen/Antibody Combo Nonreactive 07/21/2014        No results found for this or any previous visit.      No results found for this or any previous visit.      No results found for this or any previous visit.

## 2022-10-15 MED FILL — KESIMPTA PEN 20 MG/0.4 ML SUBCUTANEOUS PEN INJECTOR: SUBCUTANEOUS | 28 days supply | Qty: 0.4 | Fill #4

## 2022-10-18 MED ORDER — MODAFINIL 200 MG TABLET
ORAL_TABLET | Freq: Two times a day (BID) | ORAL | 0 refills | 90 days | Status: CP
Start: 2022-10-18 — End: 2023-10-18

## 2022-10-21 MED ORDER — AMITRIPTYLINE 25 MG TABLET
ORAL_TABLET | Freq: Every evening | ORAL | 0 refills | 30 days | Status: CP
Start: 2022-10-21 — End: ?

## 2022-10-21 NOTE — Unmapped (Signed)
Request received via interface.     Provider: [name], PA    Last Visit Date: 04/30/2022  Next Visit Date: Visit date not found    Lab Results   Component Value Date    JC Virus AB POSITIVE 08/13/2016    Hep B Surface Ag Nonreactive 03/19/2019    Hep B S Ab Nonreactive 03/12/2018    Hep B Surf Ab Quant <8.00 03/12/2018    Hep B Core Total Ab Nonreactive 03/19/2019    Hepatitis C Ab Nonreactive 04/30/2022    HIV Antigen/Antibody Combo Nonreactive 03/12/2018    HIV Antigen/Antibody Combo Nonreactive 07/21/2014        No results found for this or any previous visit.      No results found for this or any previous visit.      No results found for this or any previous visit.

## 2022-10-23 MED ORDER — AMITRIPTYLINE 25 MG TABLET
ORAL_TABLET | 1 refills | 0 days
Start: 2022-10-23 — End: ?

## 2022-10-23 NOTE — Unmapped (Signed)
Request received via interface.     Provider: [name] [name], PA    Last Visit Date: 04/30/2022  Next Visit Date: Visit date not found    Lab Results   Component Value Date    JC Virus AB POSITIVE 08/13/2016    Hep B Surface Ag Nonreactive 03/19/2019    Hep B S Ab Nonreactive 03/12/2018    Hep B Surf Ab Quant <8.00 03/12/2018    Hep B Core Total Ab Nonreactive 03/19/2019    Hepatitis C Ab Nonreactive 04/30/2022    HIV Antigen/Antibody Combo Nonreactive 03/12/2018    HIV Antigen/Antibody Combo Nonreactive 07/21/2014        No results found for this or any previous visit.      No results found for this or any previous visit.      No results found for this or any previous visit.

## 2022-10-28 MED ORDER — AMITRIPTYLINE 25 MG TABLET
ORAL_TABLET | 0 refills | 0 days | Status: CP
Start: 2022-10-28 — End: ?

## 2022-10-28 MED ORDER — CYCLOBENZAPRINE 10 MG TABLET
ORAL_TABLET | 0 refills | 0 days | Status: CP
Start: 2022-10-28 — End: ?

## 2022-10-28 NOTE — Unmapped (Signed)
Last Visit Date: 04/30/2022  Next Visit Date: No future appointment scheduled   Last seen by [facility]    Lab Results   Component Value Date    JC Virus AB POSITIVE 08/13/2016    Hep B Surface Ag Nonreactive 03/19/2019    Hep B S Ab Nonreactive 03/12/2018    Hep B Surf Ab Quant <8.00 03/12/2018    Hep B Core Total Ab Nonreactive 03/19/2019    Hepatitis C Ab Nonreactive 04/30/2022    HIV Antigen/Antibody Combo Nonreactive 03/12/2018    HIV Antigen/Antibody Combo Nonreactive 07/21/2014        No results found for this or any previous visit.      No results found for this or any previous visit.      No results found for this or any previous visit.

## 2022-11-15 NOTE — Unmapped (Signed)
[facility] Refill Coordination Note    Specialty Medication(s) to be Shipped:   Neurology: Kesimpta    Other medication(s) to be shipped: No additional medications requested for fill at this time     Donna Mata, DOB: 23-Mar-2001  Phone: [phone] (home)       All above HIPAA information was verified with patient.     Was a Nurse, learning disability used for this call? No    Completed refill call assessment today to schedule patient's medication shipment from the [facility] [phone]).  All relevant notes have been reviewed.     Specialty medication(s) and dose(s) confirmed: Regimen is correct and unchanged.   Changes to medications: Willemina reports no changes at this time.  Changes to insurance: No  New side effects reported not previously addressed with a pharmacist or physician: None reported  Questions for the pharmacist: No    Confirmed patient received a Conservation officer, historic buildings and a Surveyor, mining with first shipment. The patient will receive a drug information handout for each medication shipped and additional FDA Medication Guides as required.       DISEASE/MEDICATION-SPECIFIC INFORMATION        For patients on injectable medications: Patient currently has 0 doses left.  Next injection is scheduled for 11/17/2022.    SPECIALTY MEDICATION ADHERENCE     Medication Adherence    Patient reported X missed doses in the last month: 0  Specialty Medication: Kesimpta 20mg  / 0.53mL  Patient is on additional specialty medications: No  Informant: patient   Other adherence tool: It is part of patient's routine.   Confirmed plan for next specialty medication refill: delivery by pharmacy  Refills needed for supportive medications: not needed          Refill Coordination    Has the Patients' Contact Information Changed: No  Is the Shipping Address Different: No         Were doses missed due to medication being on hold? No    Kesimpta 20 mg/ 0.4 mL : 0 days of medicine on hand REFERRAL TO PHARMACIST     Referral to the pharmacist: Not needed      [facility]     Shipping address confirmed in Epic.       Delivery Scheduled: Yes, Expected medication delivery date: 11/19/2022.     Medication will be delivered via UPS to the prescription address in Epic WAM.    Racheal Patches Pharmacy Candidate   [facility] Specialty

## 2022-11-18 MED FILL — KESIMPTA PEN 20 MG/0.4 ML SUBCUTANEOUS PEN INJECTOR: SUBCUTANEOUS | 28 days supply | Qty: 0.4 | Fill #5

## 2022-11-21 MED ORDER — AMITRIPTYLINE 25 MG TABLET
ORAL_TABLET | 0 refills | 0 days
Start: 2022-11-21 — End: ?

## 2022-11-21 NOTE — Unmapped (Signed)
Request received via interface.     Provider: [name] [name], PA    Last Visit Date: 04/30/2022  Next Visit Date: Visit date not found    Lab Results   Component Value Date    JC Virus AB POSITIVE 08/13/2016    Hep B Surface Ag Nonreactive 03/19/2019    Hep B S Ab Nonreactive 03/12/2018    Hep B Surf Ab Quant <8.00 03/12/2018    Hep B Core Total Ab Nonreactive 03/19/2019    Hepatitis C Ab Nonreactive 04/30/2022    HIV Antigen/Antibody Combo Nonreactive 03/12/2018    HIV Antigen/Antibody Combo Nonreactive 07/21/2014        No results found for this or any previous visit.      No results found for this or any previous visit.      No results found for this or any previous visit.

## 2022-11-22 MED ORDER — AMITRIPTYLINE 25 MG TABLET
ORAL_TABLET | 0 refills | 0 days | Status: CP
Start: 2022-11-22 — End: ?

## 2022-12-18 NOTE — Unmapped (Signed)
[facility] Pharmacy Refill Coordination Note    Donna Mata [place], [place]: 29-Jul-2000  Phone: [phone] (home)       All above HIPAA information was verified with patient.         12/18/2022     5:46 AM   Specialty Rx Medication Refill Questionnaire   Which Medications would you like refilled and shipped? KESIMPTA PEN 20 mg/0.4 mL Pnij   Please list all current allergies: None   Have you missed any doses in the last 30 days? No   Have you had any changes to your medication(s) since your last refill? No   How many days remaining of each medication do you have at home? 0   Have you experienced any side effects in the last 30 days? No   Please enter the full address (street address, city, state, zip code) where you would like your medication(s) to be delivered to. [address] [place] [address]   Please specify on which day you would like your medication(s) to arrive. Note: if you need your medication(s) within 3 days, please call the pharmacy to schedule your order at [phone]  12/20/2022   Has your insurance changed since your last refill? No   Would you like a pharmacist to call you to discuss your medication(s)? No   Do you require a signature for your package? (Note: if we are billing Medicare Part B or your order contains a controlled substance, we will require a signature) No         Completed refill call assessment today to schedule patient's medication shipment from the [facility] [phone]).  All relevant notes have been reviewed.       Confirmed patient received a Conservation officer, historic buildings and a Surveyor, mining with first shipment. The patient will receive a drug information handout for each medication shipped and additional FDA Medication Guides as required.         REFERRAL TO PHARMACIST     Referral to the pharmacist: Not needed      [facility]     Shipping address confirmed in Epic.     Delivery Scheduled: Yes, Expected medication delivery date: 12/20/22 . Medication will be delivered via UPS to the prescription address in Epic WAM.    Ricci Barker   [facility] Specialty Technician

## 2022-12-19 MED FILL — KESIMPTA PEN 20 MG/0.4 ML SUBCUTANEOUS PEN INJECTOR: SUBCUTANEOUS | 28 days supply | Qty: 0.4 | Fill #6

## 2023-01-05 MED ORDER — CYCLOBENZAPRINE 10 MG TABLET
ORAL_TABLET | 0 refills | 0 days
Start: 2023-01-05 — End: ?

## 2023-01-06 NOTE — Unmapped (Signed)
Last Visit Date: 04/30/2022  Next Visit Date: No future appointment scheduled   Last seen by [facility]    Lab Results   Component Value Date    JC Virus AB POSITIVE 08/13/2016    Hep B Surface Ag Nonreactive 03/19/2019    Hep B S Ab Nonreactive 03/12/2018    Hep B Surf Ab Quant <8.00 03/12/2018    Hep B Core Total Ab Nonreactive 03/19/2019    Hepatitis C Ab Nonreactive 04/30/2022    HIV Antigen/Antibody Combo Nonreactive 03/12/2018    HIV Antigen/Antibody Combo Nonreactive 07/21/2014        No results found for this or any previous visit.      No results found for this or any previous visit.      No results found for this or any previous visit.

## 2023-01-07 MED ORDER — CYCLOBENZAPRINE 10 MG TABLET
ORAL_TABLET | 0 refills | 0 days | Status: CP
Start: 2023-01-07 — End: ?

## 2023-01-07 NOTE — Unmapped (Signed)
Neurology update:  Patient is past due for clinic visit.  My chart message and /or letter sent to patient.

## 2023-01-14 MED ORDER — MODAFINIL 200 MG TABLET
ORAL_TABLET | Freq: Two times a day (BID) | ORAL | 1 refills | 90 days | Status: CP
Start: 2023-01-14 — End: 2024-01-14

## 2023-01-15 NOTE — Unmapped (Signed)
The [facility] has made a second and final attempt to reach this patient to refill the following medication:KESIMPTA PEN 20 mg/0.4 mL Pnij (ofatumumab).      We have left voicemails on the following phone numbers: [phone], have sent a text message to the following phone numbers: [phone], and have sent a Mychart questionnaire..    Dates contacted: 01/06/23 and  01/14/23   Last scheduled delivery: 12/19/22     The patient may be at risk of non-compliance with this medication. The patient should call the [facility] at [phone]  Option 4, then Option 3: Allergy, Immunology, Pulmonary, Neurology to refill medication.    Donna Mata   [facility] Specialty and [facility]

## 2023-01-20 MED ORDER — PROMETHAZINE 12.5 MG TABLET
ORAL_TABLET | Freq: Four times a day (QID) | ORAL | 0 refills | 8 days | Status: CP | PRN
Start: 2023-01-20 — End: ?

## 2023-01-20 MED ORDER — PANTOPRAZOLE 20 MG TABLET,DELAYED RELEASE
ORAL_TABLET | Freq: Two times a day (BID) | ORAL | 1 refills | 90 days
Start: 2023-01-20 — End: ?

## 2023-01-20 NOTE — Unmapped (Signed)
Request received via interface.     Provider: Dr. Johnnye Lana    Last Visit Date: 04/30/2022  Next Visit Date: Visit date not found    Lab Results   Component Value Date    JC Virus AB POSITIVE 08/13/2016    Hep B Surface Ag Nonreactive 03/19/2019    Hep B S Ab Nonreactive 03/12/2018    Hep B Surf Ab Quant <8.00 03/12/2018    Hep B Core Total Ab Nonreactive 03/19/2019    Hepatitis C Ab Nonreactive 04/30/2022    HIV Antigen/Antibody Combo Nonreactive 03/12/2018    HIV Antigen/Antibody Combo Nonreactive 07/21/2014        No results found for this or any previous visit.      No results found for this or any previous visit.      No results found for this or any previous visit.

## 2023-01-20 NOTE — Unmapped (Signed)
Request received via interface.     Provider: [name], PA    Last Visit Date: 04/30/2022  Next Visit Date: 01/20/2023    Lab Results   Component Value Date    JC Virus AB POSITIVE 08/13/2016    Hep B Surface Ag Nonreactive 03/19/2019    Hep B S Ab Nonreactive 03/12/2018    Hep B Surf Ab Quant <8.00 03/12/2018    Hep B Core Total Ab Nonreactive 03/19/2019    Hepatitis C Ab Nonreactive 04/30/2022    HIV Antigen/Antibody Combo Nonreactive 03/12/2018    HIV Antigen/Antibody Combo Nonreactive 07/21/2014        No results found for this or any previous visit.      No results found for this or any previous visit.      No results found for this or any previous visit.

## 2023-01-24 MED ORDER — PANTOPRAZOLE 20 MG TABLET,DELAYED RELEASE
ORAL_TABLET | Freq: Two times a day (BID) | ORAL | 1 refills | 90 days
Start: 2023-01-24 — End: ?

## 2023-01-24 MED ORDER — AMITRIPTYLINE 25 MG TABLET
ORAL_TABLET | 0 refills | 0 days
Start: 2023-01-24 — End: ?

## 2023-01-24 NOTE — Unmapped (Signed)
RN called patient's mother to let her know that per [name], she asked if primary care provider can take over the protonix refill.     Mother said that [name] prescribed protonix since patient was having heartburn from steroids, but she would ask PCP then let us know if they would not fill it.     RN forwarded message to [name]. Mother voiced understanding.

## 2023-01-24 NOTE — Unmapped (Signed)
Request received via interface.     Provider: [name] PA    Last Visit Date: Visit date not found  Next Visit Date: Visit date not found    No results found for: CBC, CMP     No results found for this or any previous visit.

## 2023-01-28 MED ORDER — AMITRIPTYLINE 25 MG TABLET
ORAL_TABLET | 0 refills | 0 days | Status: CP
Start: 2023-01-28 — End: ?

## 2023-01-28 NOTE — Unmapped (Signed)
Neurology update:  Patient is past due for clinic visit.  My chart message and /or letter sent to patient.

## 2023-02-20 MED ORDER — AMITRIPTYLINE 25 MG TABLET
ORAL_TABLET | 0 refills | 0 days
Start: 2023-02-20 — End: ?

## 2023-02-20 NOTE — Unmapped (Signed)
Last Visit Date: 04/30/2022  Next Visit Date: No future appointment scheduled  Last seen by zelasky    Lab Results   Component Value Date    JC Virus AB POSITIVE 08/13/2016    Hep B Surface Ag Nonreactive 03/19/2019    Hep B S Ab Nonreactive 03/12/2018    Hep B Surf Ab Quant <8.00 03/12/2018    Hep B Core Total Ab Nonreactive 03/19/2019    Hepatitis C Ab Nonreactive 04/30/2022    HIV Antigen/Antibody Combo Nonreactive 03/12/2018    HIV Antigen/Antibody Combo Nonreactive 07/21/2014        No results found for this or any previous visit.      No results found for this or any previous visit.      No results found for this or any previous visit.

## 2023-02-24 MED ORDER — AMITRIPTYLINE 25 MG TABLET
ORAL_TABLET | ORAL | 0 refills | 0.00000 days | Status: CP
Start: 2023-02-24 — End: ?

## 2023-02-25 MED ORDER — AMITRIPTYLINE 25 MG TABLET
ORAL_TABLET | 0 refills | 0 days
Start: 2023-02-25 — End: ?

## 2023-02-25 NOTE — Unmapped (Signed)
Last Visit Date: 04/30/2022  Next Visit Date: Visit date not found  [name]  Lab Results   Component Value Date    JC Virus AB POSITIVE 08/13/2016    Hep B Surface Ag Nonreactive 03/19/2019    Hep B S Ab Nonreactive 03/12/2018    Hep B Surf Ab Quant <8.00 03/12/2018    Hep B Core Total Ab Nonreactive 03/19/2019    Hepatitis C Ab Nonreactive 04/30/2022    HIV Antigen/Antibody Combo Nonreactive 03/12/2018    HIV Antigen/Antibody Combo Nonreactive 07/21/2014        No results found for this or any previous visit.      No results found for this or any previous visit.      No results found for this or any previous visit.  Last Visit Date: Visit date not found  Next Visit Date: Visit date not found    Lab Results   Component Value Date    JC Virus AB POSITIVE 08/13/2016    Hep B Surface Ag Nonreactive 03/19/2019    Hep B S Ab Nonreactive 03/12/2018    Hep B Surf Ab Quant <8.00 03/12/2018    Hep B Core Total Ab Nonreactive 03/19/2019    Hepatitis C Ab Nonreactive 04/30/2022    HIV Antigen/Antibody Combo Nonreactive 03/12/2018    HIV Antigen/Antibody Combo Nonreactive 07/21/2014        No results found for this or any previous visit.      No results found for this or any previous visit.      No results found for this or any previous visit.

## 2023-03-17 DIAGNOSIS — G35 Multiple sclerosis: Principal | ICD-10-CM

## 2023-03-17 MED ORDER — KESIMPTA PEN 20 MG/0.4 ML SUBCUTANEOUS PEN INJECTOR
SUBCUTANEOUS | 0 refills | 28 days | Status: CP
Start: 2023-03-17 — End: ?

## 2023-04-30 MED ORDER — AMITRIPTYLINE 25 MG TABLET
ORAL_TABLET | 0 refills | 0.00 days
Start: 2023-04-30 — End: ?

## 2023-05-01 MED ORDER — AMITRIPTYLINE 25 MG TABLET
ORAL_TABLET | 0 refills | 0.00 days | Status: CP
Start: 2023-05-01 — End: ?

## 2023-05-01 NOTE — Unmapped (Signed)
Neurology update:  Patient is past due for clinic visit. Last seen and last labs = 04/2022.  My chart message and  letter sent to patient. Schedulers notified to reach out to her.  Refill Elavil for one month only.

## 2023-05-01 NOTE — Unmapped (Signed)
Request received via interface.     Provider: [name] PA    Last Visit Date: Visit date not found  Next Visit Date: Visit date not found    Lab Results   Component Value Date    JC Virus AB POSITIVE 08/13/2016    Hep B Surface Ag Nonreactive 03/19/2019    Hep B S Ab Nonreactive 03/12/2018    Hep B Surf Ab Quant <8.00 03/12/2018    Hep B Core Total Ab Nonreactive 03/19/2019    Hepatitis C Ab Nonreactive 04/30/2022    HIV Antigen/Antibody Combo Nonreactive 03/12/2018    HIV Antigen/Antibody Combo Nonreactive 07/21/2014        No results found for this or any previous visit.      No results found for this or any previous visit.      No results found for this or any previous visit.

## 2023-06-02 MED ORDER — CYCLOBENZAPRINE 10 MG TABLET
ORAL_TABLET | 0 refills | 0.00 days
Start: 2023-06-02 — End: ?

## 2023-06-03 NOTE — Unmapped (Signed)
Request received via interface.     Provider: [name] [name], PA    Last Visit Date: 04/30/2022  Next Visit Date: Visit date not found    Lab Results   Component Value Date    JC Virus AB POSITIVE 08/13/2016    Hep B Surface Ag Nonreactive 03/19/2019    Hep B S Ab Nonreactive 03/12/2018    Hep B Surf Ab Quant <8.00 03/12/2018    Hep B Core Total Ab Nonreactive 03/19/2019    Hepatitis C Ab Nonreactive 04/30/2022    HIV Antigen/Antibody Combo Nonreactive 03/12/2018    HIV Antigen/Antibody Combo Nonreactive 07/21/2014        No results found for this or any previous visit.      No results found for this or any previous visit.      No results found for this or any previous visit.

## 2023-06-04 MED ORDER — CYCLOBENZAPRINE 10 MG TABLET
ORAL_TABLET | 0 refills | 0.00 days
Start: 2023-06-04 — End: ?

## 2023-06-04 NOTE — Unmapped (Signed)
Neurology update:  Patient is past due for clinic visit.  Numerous my chart messages and letters have been sent to patient.  Refills denied.

## 2023-08-12 MED ORDER — MODAFINIL 200 MG TABLET
ORAL_TABLET | Freq: Two times a day (BID) | ORAL | 0 refills | 0.00 days
Start: 2023-08-12 — End: ?

## 2023-08-13 MED ORDER — MODAFINIL 200 MG TABLET
ORAL_TABLET | Freq: Two times a day (BID) | ORAL | 0 refills | 90.00 days | Status: CP
Start: 2023-08-13 — End: 2024-08-12

## 2023-08-13 NOTE — Unmapped (Signed)
 Request received via interface.     Provider: [name] [name]/Dr. Irving Mantle    Last Visit Date: 04/30/2022  Next Visit Date: Visit date not found    Lab Results   Component Value Date    JC Virus AB POSITIVE 08/13/2016    Hep B Surface Ag Nonreactive 03/19/2019    Hep B S Ab Nonreactive 03/12/2018    Hep B Surf Ab Quant <8.00 03/12/2018    Hep B Core Total Ab Nonreactive 03/19/2019    Hepatitis C Ab Nonreactive 04/30/2022    HIV Antigen/Antibody Combo Nonreactive 03/12/2018    HIV Antigen/Antibody Combo Nonreactive 07/21/2014        No results found for this or any previous visit.      No results found for this or any previous visit.      No results found for this or any previous visit.

## 2023-09-12 NOTE — Unmapped (Signed)
 Specialty Medication(s): Kesimpta    Ms.Milo has been dis-enrolled from the [facility] specialty pharmacy services as a result of multiple unsuccessful outreach attempts by the pharmacy.    Additional information provided to the patient: n/a    Milagros Alf, PharmD  [facility] Specialty Pharmacist
